# Patient Record
Sex: Male | Born: 1990 | Race: Black or African American | Hispanic: No | Marital: Single | Smoking: Current every day smoker
Health system: Southern US, Community
[De-identification: ages and names within clinical notes are randomized; demographics above are authoritative.]

## PROBLEM LIST (undated history)

## (undated) DIAGNOSIS — B20 Human immunodeficiency virus [HIV] disease: Secondary | ICD-10-CM

## (undated) DIAGNOSIS — Z21 Asymptomatic human immunodeficiency virus [HIV] infection status: Secondary | ICD-10-CM

## (undated) DIAGNOSIS — A64 Unspecified sexually transmitted disease: Secondary | ICD-10-CM

---

## 2009-08-04 ENCOUNTER — Emergency Department (HOSPITAL_COMMUNITY): Admission: EM | Admit: 2009-08-04 | Discharge: 2009-08-04 | Payer: Self-pay | Admitting: Emergency Medicine

## 2010-10-23 ENCOUNTER — Emergency Department (HOSPITAL_BASED_OUTPATIENT_CLINIC_OR_DEPARTMENT_OTHER)
Admission: EM | Admit: 2010-10-23 | Discharge: 2010-10-23 | Disposition: A | Discharge status: Home / Self Care | Payer: Self-pay | Attending: Emergency Medicine | Admitting: Emergency Medicine

## 2010-10-23 DIAGNOSIS — F17200 Nicotine dependence, unspecified, uncomplicated: Secondary | ICD-10-CM | POA: Insufficient documentation

## 2010-10-23 DIAGNOSIS — J45909 Unspecified asthma, uncomplicated: Secondary | ICD-10-CM | POA: Insufficient documentation

## 2010-10-23 DIAGNOSIS — J029 Acute pharyngitis, unspecified: Secondary | ICD-10-CM | POA: Insufficient documentation

## 2010-11-11 ENCOUNTER — Emergency Department (HOSPITAL_BASED_OUTPATIENT_CLINIC_OR_DEPARTMENT_OTHER)
Admission: EM | Admit: 2010-11-11 | Discharge: 2010-11-11 | Disposition: A | Discharge status: Home / Self Care | Payer: Self-pay | Attending: Emergency Medicine | Admitting: Emergency Medicine

## 2010-11-11 DIAGNOSIS — F17200 Nicotine dependence, unspecified, uncomplicated: Secondary | ICD-10-CM | POA: Insufficient documentation

## 2010-11-11 DIAGNOSIS — J45909 Unspecified asthma, uncomplicated: Secondary | ICD-10-CM | POA: Insufficient documentation

## 2010-11-11 DIAGNOSIS — N342 Other urethritis: Secondary | ICD-10-CM | POA: Insufficient documentation

## 2010-11-11 DIAGNOSIS — R300 Dysuria: Secondary | ICD-10-CM | POA: Insufficient documentation

## 2010-11-11 LAB — URINALYSIS, ROUTINE W REFLEX MICROSCOPIC
Bilirubin Urine: NEGATIVE
Glucose, UA: NEGATIVE mg/dL
Hgb urine dipstick: NEGATIVE
Ketones, ur: NEGATIVE mg/dL
Protein, ur: NEGATIVE mg/dL
Urobilinogen, UA: 1 mg/dL (ref 0.0–1.0)

## 2010-11-11 LAB — URINE MICROSCOPIC-ADD ON

## 2010-11-13 LAB — URINE CULTURE: Colony Count: NO GROWTH

## 2010-11-13 LAB — GC/CHLAMYDIA PROBE AMP, GENITAL: Chlamydia, DNA Probe: POSITIVE — AB

## 2010-11-19 LAB — URINE CULTURE: Colony Count: 9000

## 2010-11-19 LAB — URINALYSIS, ROUTINE W REFLEX MICROSCOPIC
Bilirubin Urine: NEGATIVE
Glucose, UA: NEGATIVE mg/dL
Nitrite: NEGATIVE
Specific Gravity, Urine: 1.023 (ref 1.005–1.030)
pH: 8.5 — ABNORMAL HIGH (ref 5.0–8.0)

## 2010-11-19 LAB — GC/CHLAMYDIA PROBE AMP, GENITAL: Chlamydia, DNA Probe: NEGATIVE

## 2010-11-19 LAB — URINE MICROSCOPIC-ADD ON

## 2011-03-22 ENCOUNTER — Emergency Department (HOSPITAL_BASED_OUTPATIENT_CLINIC_OR_DEPARTMENT_OTHER)
Admission: EM | Admit: 2011-03-22 | Discharge: 2011-03-22 | Disposition: A | Discharge status: Home / Self Care | Payer: 59 | Attending: Emergency Medicine | Admitting: Emergency Medicine

## 2011-03-22 ENCOUNTER — Encounter: Payer: Self-pay | Admitting: *Deleted

## 2011-03-22 DIAGNOSIS — J45909 Unspecified asthma, uncomplicated: Secondary | ICD-10-CM | POA: Insufficient documentation

## 2011-03-22 DIAGNOSIS — IMO0002 Reserved for concepts with insufficient information to code with codable children: Secondary | ICD-10-CM | POA: Insufficient documentation

## 2011-03-22 DIAGNOSIS — L0390 Cellulitis, unspecified: Secondary | ICD-10-CM

## 2011-03-22 MED ORDER — LIDOCAINE HCL (PF) 1 % IJ SOLN
5.0000 mL | Freq: Once | INTRAMUSCULAR | Status: AC
  Administered 2011-03-22: 5 mL via INTRADERMAL

## 2011-03-22 NOTE — ED Notes (Signed)
Abscess to the back of his upper right arm.

## 2011-03-22 NOTE — ED Notes (Signed)
Simple I&D of RUE abscess performed by Dr Dierdre Highman. Dressing applied. Pt instructed on continued care and f/u orders.

## 2011-03-22 NOTE — ED Provider Notes (Signed)
History     CSN: [id number] Arrival date & time: 03/22/2011  9:16 PM  Chief Complaint  Patient presents with  . Abscess   Patient is a 20 y.o. male presenting with abscess. The history is provided by the patient.  Abscess  This is a new problem. The current episode started yesterday. The problem occurs continuously. The problem has been gradually worsening. The abscess is present on the right arm. The problem is moderate. The abscess is characterized by redness and painfulness. The patient was exposed to an insect bite/sting. The abscess first occurred at home. Pertinent negatives include no fever, no diarrhea and no vomiting. His past medical history does not include atopy in family. There were no sick contacts. He has received no recent medical care.   Saw a spider on his arm and believes it may have bit him. Area became red and swollen and did drain some yesterday when squeezed and today is worse Past Medical History  Diagnosis Date  . Asthma     History reviewed. No pertinent past surgical history.  No family history on file.  History  Substance Use Topics  . Smoking status: Current Some Day Smoker  . Smokeless tobacco: Not on file  . Alcohol Use: No      Review of Systems  Constitutional: Negative for fever and chills.  HENT: Negative for neck pain and neck stiffness.   Eyes: Negative for pain.  Respiratory: Negative for shortness of breath.   Cardiovascular: Negative for chest pain.  Gastrointestinal: Negative for vomiting, abdominal pain and diarrhea.  Genitourinary: Negative for dysuria.  Musculoskeletal: Negative for back pain.  Skin: Positive for wound. Negative for rash.  Neurological: Negative for headaches.  All other systems reviewed and are negative.    Physical Exam  BP 119/75  Pulse 63  Temp(Src) 98.7 F (37.1 C) (Oral)  Resp 18  SpO2 99%  Physical Exam  Constitutional: He is oriented to person, place, and time. He appears well-developed and  well-nourished.  HENT:  Head: Normocephalic and atraumatic.  Eyes: Conjunctivae and EOM are normal. Pupils are equal, round, and reactive to light.  Neck: Trachea normal. Neck supple. No thyromegaly present.  Cardiovascular: Normal rate, regular rhythm, S1 normal, S2 normal and normal pulses.     No systolic murmur is present   No diastolic murmur is present  Pulses:      Radial pulses are 2+ on the right side, and 2+ on the left side.  Pulmonary/Chest: Effort normal and breath sounds normal. He has no rhonchi.  Abdominal: Soft. Normal appearance and bowel sounds are normal. There is no tenderness. There is no CVA tenderness and negative Murphy's sign.  Musculoskeletal:       RUE: area of fluctuance, tenderness, pointing and erythema to bicep area, no streaking, distal N/V intact, single lesion.   Neurological: He is alert and oriented to person, place, and time. He has normal strength. No cranial nerve deficit or sensory deficit. GCS eye subscore is 4. GCS verbal subscore is 5. GCS motor subscore is 6.  Skin: Skin is warm and dry. No rash noted. He is not diaphoretic.  Psychiatric: His speech is normal.       Cooperative and appropriate    ED Course  INCISION AND DRAINAGE Date/Time: 03/22/2011 10:40 PM Performed by: Sunnie Nielsen Authorized by: Sunnie Nielsen Consent: Verbal consent obtained. Risks and benefits: risks, benefits and alternatives were discussed Consent given by: patient Patient understanding: patient states understanding of the procedure being  performed Patient identity confirmed: verbally with patient Time out: Immediately prior to procedure a "time out" was called to verify the correct patient, procedure, equipment, support staff and site/side marked as required. Type: abscess Body area: upper extremity Location details: right arm Anesthesia: local infiltration Local anesthetic: lidocaine 1% without epinephrine Anesthetic total: 2 ml Risk factor: underlying major  vessel Scalpel size: 11 Needle gauge: 22 Incision type: elliptical Complexity: complex Drainage: purulent Drainage amount: moderate Wound treatment: wound left open Packing material: Vaseline gauze Patient tolerance: Patient tolerated the procedure well with no immediate complications.    MDM R arm abscess treated I/D. No indication for ABx.       Sunnie Nielsen, MD 03/22/11 2242

## 2011-03-22 NOTE — ED Notes (Signed)
Pt states was bitten by a "baby thin brown spider" last night, and the area is now red/swollen/tender to touch. Pt states he tried to "pop the white head" last night, but that the appearance has progressively worsened.

## 2011-07-01 ENCOUNTER — Emergency Department (HOSPITAL_BASED_OUTPATIENT_CLINIC_OR_DEPARTMENT_OTHER)
Admission: EM | Admit: 2011-07-01 | Discharge: 2011-07-01 | Disposition: A | Discharge status: Home / Self Care | Payer: 59 | Attending: Emergency Medicine | Admitting: Emergency Medicine

## 2011-07-01 ENCOUNTER — Encounter (HOSPITAL_BASED_OUTPATIENT_CLINIC_OR_DEPARTMENT_OTHER): Payer: Self-pay | Admitting: *Deleted

## 2011-07-01 DIAGNOSIS — N342 Other urethritis: Secondary | ICD-10-CM

## 2011-07-01 DIAGNOSIS — R109 Unspecified abdominal pain: Secondary | ICD-10-CM | POA: Insufficient documentation

## 2011-07-01 DIAGNOSIS — F17200 Nicotine dependence, unspecified, uncomplicated: Secondary | ICD-10-CM | POA: Insufficient documentation

## 2011-07-01 MED ORDER — CEFTRIAXONE SODIUM 250 MG IJ SOLR
250.0000 mg | Freq: Once | INTRAMUSCULAR | Status: AC
  Administered 2011-07-01: 250 mg via INTRAMUSCULAR
  Filled 2011-07-01: qty 250

## 2011-07-01 MED ORDER — DOXYCYCLINE HYCLATE 100 MG PO CAPS: 100.0000 mg | ORAL_CAPSULE | Freq: Two times a day (BID) | ORAL | Status: AC

## 2011-07-01 MED ORDER — DOXYCYCLINE HYCLATE 100 MG PO CAPS: 100.0000 mg | ORAL_CAPSULE | Freq: Two times a day (BID) | ORAL | Status: DC

## 2011-07-01 MED ORDER — AZITHROMYCIN 250 MG PO TABS
1000.0000 mg | ORAL_TABLET | Freq: Once | ORAL | Status: AC
  Administered 2011-07-01: 1000 mg via ORAL
  Filled 2011-07-01: qty 4

## 2011-07-01 NOTE — ED Provider Notes (Signed)
History  Scribed for Gavin Pound. Remington Highbaugh, MD, the patient was seen in room MH10. This chart was scribed by Hillery Hunter.   CSN: [id number] Arrival date & time: 07/01/2011  4:30 PM   First MD Initiated Contact with Patient 07/01/11 1649      Chief Complaint  Patient presents with  . SEXUALLY TRANSMITTED DISEASE    Pt. reports burning and discharge  . Abdominal Pain    no vomiting or diarrhea    The history is provided by the patient.   William Sellers is a 20 y.o. male who presents to the Emergency Department complaining of burning dysuria since 20:00 last night (21 hours ago) with some yellow penile discharge. He reports some additional lower abdominal discomfort but denies fevers, genital lesions, and any other associated symptoms.  He reports that he had unprotected sex two weeks ago and is concerned that he might have contracted gonorrhea which he had similar symptoms two years ago with confirmed diagnosis. He describes the dysuria as burning that starts at the very beginning of urination and then after finishing urinating. He states he quit smoking cigarettes and does not take any current medications.     Past Medical History  Diagnosis Date  . Asthma     History reviewed. No pertinent past surgical history.  History reviewed. No pertinent family history.  History  Substance Use Topics  . Smoking status: Current Some Day Smoker  . Smokeless tobacco: Not on file  . Alcohol Use: No      Review of Systems  Constitutional: Negative for fever.  Gastrointestinal: Positive for abdominal pain (lower abdomen).  Genitourinary: Positive for dysuria and discharge (yellow). Negative for genital sores.  Skin: Negative for rash.    Allergies  Review of patient's allergies indicates no known allergies.  Home Medications   Current Outpatient Rx  Name Route Sig Dispense Refill  . BC HEADACHE POWDER PO Oral Take 1 packet by mouth once as needed. For pain     .  DOXYCYCLINE HYCLATE 100 MG PO CAPS Oral Take 1 capsule (100 mg total) by mouth 2 (two) times daily. 20 capsule 0  . IBUPROFEN 200 MG PO TABS Oral Take 200 mg by mouth once as needed. For pain     . FISH OIL CONCENTRATE PO Oral Take 1 capsule by mouth daily.        BP 120/63  Pulse 75  Temp(Src) 98 F (36.7 C) (Oral)  Resp 18  Ht 6\' 1"  (1.854 m)  Wt 142 lb (64.411 kg)  BMI 18.73 kg/m2  SpO2 99%  Physical Exam  Nursing note and vitals reviewed. Constitutional: He is oriented to person, place, and time. He appears well-developed and well-nourished. No distress.  HENT:  Head: Normocephalic and atraumatic.  Eyes: Conjunctivae are normal. Right eye exhibits no discharge. Left eye exhibits no discharge.  Neck: Neck supple.  Cardiovascular: Normal rate and regular rhythm.   Pulmonary/Chest: Effort normal. No respiratory distress.  Genitourinary:       Yellow discharge from urethral meatus, no genital lesions  Neurological: He is alert and oriented to person, place, and time.  Skin: Skin is warm and dry.  Psychiatric: He has a normal mood and affect. His behavior is normal.    ED Course  Procedures    Labs Reviewed  GC/CHLAMYDIA PROBE AMP, GENITAL     OTHER DATA REVIEWED: Nursing notes, vital signs reviewed.    DIAGNOSTIC STUDIES: Oxygen Saturation is 99% on room air, normal by my  interpretation.     ED COURSE / COORDINATION OF CARE: 17:00. Ordered: GC/chlamydia probe amp, genital, cefTRIAXone (ROCEPHIN) injection 250 mg ; azithromycin (ZITHROMAX) tablet 1,000 mg, anticipate discharge.    MDM  Exam is consistent with STD.  Culture probe sent.  Safe sex practices encouraged.  abd is soft, no loss of apettite.  No testicular or scrotal involvement.  IM ceftriax and zithromax and prescription for doxyc given   1. Urethritis       I personally performed the services described in this documentation, which was scribed in my presence. The recorded information has been  reviewed and considered. Virgene Tirone Y.    Gavin Pound. Oletta Lamas, MD 07/01/11 1610

## 2011-07-01 NOTE — ED Notes (Signed)
Pt. Reports STD approx. 2 yrs ago and he is now having discharge and burning with urination.  Pt. Reports low abd. pain

## 2011-07-02 LAB — GC/CHLAMYDIA PROBE AMP, GENITAL
Chlamydia, DNA Probe: NEGATIVE
GC Probe Amp, Genital: POSITIVE — AB

## 2011-07-03 NOTE — ED Notes (Addendum)
+   gonorrhea Patient treated with Rocephin and Zithromax.[facility] letter faxed.Call and notify patient. Patient informed of positive results after id'd x 2 and informed of need to notify partner to be treated.

## 2012-08-28 ENCOUNTER — Emergency Department (HOSPITAL_BASED_OUTPATIENT_CLINIC_OR_DEPARTMENT_OTHER)
Admission: EM | Admit: 2012-08-28 | Discharge: 2012-08-28 | Disposition: A | Discharge status: Home / Self Care | Payer: 59 | Attending: Emergency Medicine | Admitting: Emergency Medicine

## 2012-08-28 ENCOUNTER — Encounter (HOSPITAL_BASED_OUTPATIENT_CLINIC_OR_DEPARTMENT_OTHER): Payer: Self-pay | Admitting: Emergency Medicine

## 2012-08-28 DIAGNOSIS — R109 Unspecified abdominal pain: Secondary | ICD-10-CM | POA: Insufficient documentation

## 2012-08-28 DIAGNOSIS — N453 Epididymo-orchitis: Secondary | ICD-10-CM | POA: Insufficient documentation

## 2012-08-28 DIAGNOSIS — N451 Epididymitis: Secondary | ICD-10-CM

## 2012-08-28 DIAGNOSIS — Z79899 Other long term (current) drug therapy: Secondary | ICD-10-CM | POA: Insufficient documentation

## 2012-08-28 DIAGNOSIS — J45909 Unspecified asthma, uncomplicated: Secondary | ICD-10-CM | POA: Insufficient documentation

## 2012-08-28 DIAGNOSIS — Z87891 Personal history of nicotine dependence: Secondary | ICD-10-CM | POA: Insufficient documentation

## 2012-08-28 MED ORDER — OXYCODONE-ACETAMINOPHEN 5-325 MG PO TABS: 2.0000 | ORAL_TABLET | ORAL | Status: DC | PRN

## 2012-08-28 MED ORDER — AZITHROMYCIN 1 G PO PACK
1.0000 g | PACK | Freq: Once | ORAL | Status: AC
  Administered 2012-08-28: 1 g via ORAL
  Filled 2012-08-28: qty 1

## 2012-08-28 MED ORDER — OXYCODONE-ACETAMINOPHEN 5-325 MG PO TABS
2.0000 | ORAL_TABLET | Freq: Once | ORAL | Status: AC
  Administered 2012-08-28: 2 via ORAL
  Filled 2012-08-28 (×2): qty 2

## 2012-08-28 NOTE — ED Provider Notes (Signed)
History     CSN: [id number]  Arrival date & time 08/28/12  2029   First MD Initiated Contact with Patient 08/28/12 2038      Chief Complaint  Patient presents with  . Testicle Pain  . Abdominal Pain     HPI Pt c/o generalized lower abd pain and groin area w/ swollen Rt testicle x 2 days. Pt denies N/V/D, fever, or dysuria.  Onset of pain gradual.  Pain originates in testicle and radiates to the lower abdomen.  No history of trauma.  Past Medical History  Diagnosis Date  . Asthma     History reviewed. No pertinent past surgical history.  No family history on file.  History  Substance Use Topics  . Smoking status: Former Games developer  . Smokeless tobacco: Not on file  . Alcohol Use: Yes     Comment: ocassionally      Review of Systems All other systems reviewed and are negative Allergies  Review of patient's allergies indicates no known allergies.  Home Medications   Current Outpatient Rx  Name  Route  Sig  Dispense  Refill  . BC HEADACHE POWDER PO   Oral   Take 1 packet by mouth once as needed. For pain          . IBUPROFEN 200 MG PO TABS   Oral   Take 200 mg by mouth once as needed. For pain          . FISH OIL CONCENTRATE PO   Oral   Take 1 capsule by mouth daily.           . OXYCODONE-ACETAMINOPHEN 5-325 MG PO TABS   Oral   Take 2 tablets by mouth every 4 (four) hours as needed for pain.   6 tablet   0     BP 115/66  Pulse 85  Temp 99.4 F (37.4 C) (Oral)  Resp 20  Ht 6\' 1"  (1.854 m)  Wt 147 lb (66.679 kg)  BMI 19.39 kg/m2  SpO2 98%  Physical Exam  Nursing note and vitals reviewed. Constitutional: He is oriented to person, place, and time. He appears well-developed and well-nourished. No distress.  HENT:  Head: Normocephalic and atraumatic.  Eyes: Pupils are equal, round, and reactive to light.  Neck: Normal range of motion.  Cardiovascular: Normal rate and intact distal pulses.   Pulmonary/Chest: No respiratory distress.    Abdominal: Normal appearance. He exhibits no distension. Hernia confirmed negative in the right inguinal area.  Genitourinary: Cremasteric reflex is present. Right testis shows tenderness (Right epididymis). No discharge found.  Musculoskeletal: Normal range of motion.  Neurological: He is alert and oriented to person, place, and time. No cranial nerve deficit.  Skin: Skin is warm and dry. No rash noted.  Psychiatric: He has a normal mood and affect. His behavior is normal.    ED Course  Procedures (including critical care time)  Medications  azithromycin (ZITHROMAX) powder 1 g (not administered)  oxyCODONE-acetaminophen (PERCOCET/ROXICET) 5-325 MG per tablet 2 tablet (not administered)  oxyCODONE-acetaminophen (PERCOCET/ROXICET) 5-325 MG per tablet (not administered)    Labs Reviewed - No data to display No results found.   1. Epididymitis       MDM          Nelia Shi, MD 08/28/12 [phone]

## 2012-08-28 NOTE — ED Notes (Addendum)
Pt c/o generalized lower abd pain and groin area w/ swollen Rt testicle x 2 days. Pt denies N/V/D, fever, or dysuria. Pt denies penile d/c.

## 2012-10-30 ENCOUNTER — Telehealth (HOSPITAL_COMMUNITY): Payer: Self-pay | Admitting: *Deleted

## 2012-10-30 ENCOUNTER — Emergency Department (HOSPITAL_BASED_OUTPATIENT_CLINIC_OR_DEPARTMENT_OTHER)
Admission: EM | Admit: 2012-10-30 | Discharge: 2012-10-30 | Disposition: A | Discharge status: Home / Self Care | Payer: 59 | Attending: Emergency Medicine | Admitting: Emergency Medicine

## 2012-10-30 ENCOUNTER — Encounter (HOSPITAL_BASED_OUTPATIENT_CLINIC_OR_DEPARTMENT_OTHER): Payer: Self-pay

## 2012-10-30 DIAGNOSIS — R197 Diarrhea, unspecified: Secondary | ICD-10-CM | POA: Insufficient documentation

## 2012-10-30 DIAGNOSIS — R350 Frequency of micturition: Secondary | ICD-10-CM | POA: Insufficient documentation

## 2012-10-30 DIAGNOSIS — A64 Unspecified sexually transmitted disease: Secondary | ICD-10-CM | POA: Insufficient documentation

## 2012-10-30 DIAGNOSIS — R109 Unspecified abdominal pain: Secondary | ICD-10-CM | POA: Insufficient documentation

## 2012-10-30 DIAGNOSIS — R300 Dysuria: Secondary | ICD-10-CM | POA: Insufficient documentation

## 2012-10-30 DIAGNOSIS — J45909 Unspecified asthma, uncomplicated: Secondary | ICD-10-CM | POA: Insufficient documentation

## 2012-10-30 DIAGNOSIS — Z87891 Personal history of nicotine dependence: Secondary | ICD-10-CM | POA: Insufficient documentation

## 2012-10-30 LAB — URINALYSIS, ROUTINE W REFLEX MICROSCOPIC
Protein, ur: NEGATIVE mg/dL
Urobilinogen, UA: 0.2 mg/dL (ref 0.0–1.0)

## 2012-10-30 LAB — CBC WITH DIFFERENTIAL/PLATELET
Band Neutrophils: 1 % (ref 0–10)
Basophils Relative: 0 % (ref 0–1)
HCT: 46.5 % (ref 39.0–52.0)
Hemoglobin: 16.6 g/dL (ref 13.0–17.0)
Lymphs Abs: 1 10*3/uL (ref 0.7–4.0)
MCH: 29.7 pg (ref 26.0–34.0)
MCHC: 35.7 g/dL (ref 30.0–36.0)
MCV: 83.3 fL (ref 78.0–100.0)
Neutro Abs: 4.2 10*3/uL (ref 1.7–7.7)

## 2012-10-30 LAB — BASIC METABOLIC PANEL
CO2: 27 mEq/L (ref 19–32)
Calcium: 9.9 mg/dL (ref 8.4–10.5)
Sodium: 139 mEq/L (ref 135–145)

## 2012-10-30 LAB — HIV ANTIBODY (ROUTINE TESTING W REFLEX): HIV: NONREACTIVE

## 2012-10-30 LAB — URINE MICROSCOPIC-ADD ON

## 2012-10-30 LAB — RPR TITER: RPR Titer: 1:8 {titer} — AB

## 2012-10-30 MED ORDER — PENICILLIN G BENZATHINE 1200000 UNIT/2ML IM SUSP
2.4000 10*6.[IU] | Freq: Once | INTRAMUSCULAR | Status: AC
  Administered 2012-10-30: 2.4 10*6.[IU] via INTRAMUSCULAR
  Filled 2012-10-30 (×2): qty 2

## 2012-10-30 NOTE — ED Provider Notes (Signed)
History     CSN: [id number]  Arrival date & time 10/30/12  1153   First MD Initiated Contact with Patient 10/30/12 1212      Chief Complaint  Patient presents with  . Abdominal Pain  . Diarrhea  . Urinary Frequency    (Consider location/radiation/quality/duration/timing/severity/associated sxs/prior treatment) HPI Comments: Patient is a 22 y/o M with PMHx of asthma, c/o abdominal pain, diarrhea, and dysuria x 1 day. Patient reported that abdominal pain started at approximately 7:00pm yesterday (10/29/2012), reported discomfort to be mainly in the central abdominal region, described as a gaseous and bloated feeling, rated pain 7/10 with no radiation, discomfort improved with burping. Patient reported 4 episodes of diarrhea, last BM was 9:00am today (10/30/2012), patient described as loose stool, reporting no mucus or blood. Patient reported dysuria beginning this morning (10/30/2012), reported that he noticed a "cut" on his penis this morning, pain occur when urine hits the "cut." Patient has not been using any medication. Denied nausea, fever, vomiting, chest pain, SOB, difficulty breathing, HA, dizziness, sore throat, changes to appetite, nausea.  Patient reported being sexually active with both males and females. Patient reported using protection. Reported last event of sexual activity to be approximately one week ago, patient reported using protection at this time. Patient denied discharge, denied ever having penile sores/lesions.  Patient reported mass to lower left inguinal region, stated that he just noticed mass this morning (10/30/2012). Stated that patient was moving furniture around Monday (10/26/2012). Denied pain, inflammation, swelling.     The history is provided by the patient.    Past Medical History  Diagnosis Date  . Asthma     History reviewed. No pertinent past surgical history.  No family history on file.  History  Substance Use Topics  . Smoking status: Former  Games developer  . Smokeless tobacco: Not on file  . Alcohol Use: Yes     Comment: ocassionally      Review of Systems  Constitutional: Negative for appetite change.  HENT: Negative for congestion and neck pain.   Eyes: Negative for pain.  Gastrointestinal: Positive for abdominal pain and diarrhea.       Abdominal mass to lower left inguinal region   Genitourinary: Negative for discharge.       Penile lesion   Musculoskeletal: Negative for back pain and arthralgias.  Skin: Negative for rash.  Neurological: Negative for dizziness, light-headedness and headaches.    Allergies  Review of patient's allergies indicates no known allergies.  Home Medications  No current outpatient prescriptions on file.  BP 119/72  Pulse 79  Temp(Src) 98.2 F (36.8 C) (Oral)  Resp 18  Ht 6\' 1"  (1.854 m)  Wt 147 lb (66.679 kg)  BMI 19.4 kg/m2  SpO2 100%  Physical Exam  Nursing note and vitals reviewed. Constitutional: He appears well-developed and well-nourished.  HENT:  Right Ear: External ear normal.  Left Ear: External ear normal.  Mouth/Throat: Oropharynx is clear and moist. No oropharyngeal exudate.  Eyes: Conjunctivae and EOM are normal. Pupils are equal, round, and reactive to light. Right eye exhibits no discharge. Left eye exhibits no discharge.  Neck: Normal range of motion. Neck supple.  Negative lymphadenopathy  Cardiovascular: Normal rate, regular rhythm, normal heart sounds and intact distal pulses.   No murmur heard. Pulmonary/Chest: Effort normal and breath sounds normal. He has no wheezes. He has no rales.  Abdominal: Soft. Bowel sounds are normal. He exhibits no distension. There is no tenderness. There is no rebound.  Abdomen  non-distended Bowel sounds normoactive Soft, non-tender upon light and deep palpation   Positive left inguinal lymphadenpathy - soft, mobile, non-tender to palpation   Genitourinary:  1.5-43mm pink papule located on tip of penis - possible chancre No  discharge   Lymphadenopathy:    He has no cervical adenopathy.  Skin: Skin is warm and dry. No rash noted. He is not diaphoretic.  No rash to palms of hands and soles of feet  Psychiatric: He has a normal mood and affect. His behavior is normal.    ED Course  Procedures (including critical care time)  Labs Reviewed  URINALYSIS, ROUTINE W REFLEX MICROSCOPIC - Abnormal; Notable for the following:    Leukocytes, UA TRACE (*)    All other components within normal limits  CBC WITH DIFFERENTIAL - Abnormal; Notable for the following:    Monocytes Relative 14 (*)    All other components within normal limits  GC/CHLAMYDIA PROBE AMP  URINE MICROSCOPIC-ADD ON  BASIC METABOLIC PANEL  RPR  HIV ANTIBODY (ROUTINE TESTING)   Blood pressure 119/72, pulse 79, temperature 98.2 F (36.8 C), temperature source Oral, resp. rate 18, height 6\' 1"  (1.854 m), weight 147 lb (66.679 kg), SpO2 100.00%.  Results for orders placed during the hospital encounter of 10/30/12  URINALYSIS, ROUTINE W REFLEX MICROSCOPIC      Result Value Range   Color, Urine YELLOW  YELLOW   APPearance CLEAR  CLEAR   Specific Gravity, Urine 1.025  1.005 - 1.030   pH 7.0  5.0 - 8.0   Glucose, UA NEGATIVE  NEGATIVE mg/dL   Hgb urine dipstick NEGATIVE  NEGATIVE   Bilirubin Urine NEGATIVE  NEGATIVE   Ketones, ur NEGATIVE  NEGATIVE mg/dL   Protein, ur NEGATIVE  NEGATIVE mg/dL   Urobilinogen, UA 0.2  0.0 - 1.0 mg/dL   Nitrite NEGATIVE  NEGATIVE   Leukocytes, UA TRACE (*) NEGATIVE  URINE MICROSCOPIC-ADD ON      Result Value Range   Squamous Epithelial / LPF RARE  RARE   WBC, UA 3-6  <3 WBC/hpf   RBC / HPF 0-2  <3 RBC/hpf   Bacteria, UA RARE  RARE   Urine-Other MUCOUS PRESENT    CBC WITH DIFFERENTIAL      Result Value Range   WBC 6.2  4.0 - 10.5 K/uL   RBC 5.58  4.22 - 5.81 MIL/uL   Hemoglobin 16.6  13.0 - 17.0 g/dL   HCT 40.9  81.1 - 91.4 %   MCV 83.3  78.0 - 100.0 fL   MCH 29.7  26.0 - 34.0 pg   MCHC 35.7  30.0 - 36.0  g/dL   RDW 78.2  95.6 - 21.3 %   Platelets 254  150 - 400 K/uL   Neutrophils Relative 66  43 - 77 %   Lymphocytes Relative 16  12 - 46 %   Monocytes Relative 14 (*) 3 - 12 %   Eosinophils Relative 2  0 - 5 %   Basophils Relative 0  0 - 1 %   Band Neutrophils 1  0 - 10 %   Myelocytes 1     Neutro Abs 4.2  1.7 - 7.7 K/uL   Lymphs Abs 1.0  0.7 - 4.0 K/uL   Monocytes Absolute 0.9  0.1 - 1.0 K/uL   Eosinophils Absolute 0.1  0.0 - 0.7 K/uL   Basophils Absolute 0.0  0.0 - 0.1 K/uL   WBC Morphology ATYPICAL LYMPHOCYTES     Smear Review LARGE  PLATELETS PRESENT    BASIC METABOLIC PANEL      Result Value Range   Sodium 139  135 - 145 mEq/L   Potassium 3.9  3.5 - 5.1 mEq/L   Chloride 101  96 - 112 mEq/L   CO2 27  19 - 32 mEq/L   Glucose, Bld 90  70 - 99 mg/dL   BUN 10  6 - 23 mg/dL   Creatinine, Ser 6.04  0.50 - 1.35 mg/dL   Calcium 9.9  8.4 - 54.0 mg/dL   GFR calc non Af Amer >90  >90 mL/min   GFR calc Af Amer >90  >90 mL/min   No results found.     MDM  Patient is a 22 y/o M with PMHx of asthma, c/o abdominal pain, diarrhea, dysuria x 1 day.  1.) Abdominal pain with diarrhea Denied vomiting, nausea, fever, chest pain, SOB PE: Abdomen - non-distended, BS normoactive, soft non-tender to palpation. CBC and CMP order to r/o possible viral gastroenteritis  CBC - negative findings, within normal limits BMP- negative findings  2.) Dysuria Patient reported "cut" on tip of penis, pain when urine contacts "cut." Patient sexually active with both males and females, reported last sexual encounter was 1 week ago, reported using protection. PE: 1.5-31mm pink papule, suspected chancre, on tip of penis. No discharge, swelling, inflammation to penis or groin region. Left inguinal lymphadenopathy - mobile, soft, non-tender. Reviewed chart, patient has had history of STDs - diagnosed with GC/chlamydia on 11/11/2010 and 07/01/2011.  Ordered STD Panel (GC/Chlamydia, Syphilis, HIV), UA to r/o STD  infection  Obtained verbal consent from patient for HIV testing. UA- trace of leukocytes; negative leukocytes, protein, bacteria, glucose Administered PCN G Benzathine IM 2.4 Million Units Discussed with patient that he will receive a phone call regarding results for HIV, GC/Chlamydia, and Syphilis if positive, if negative no call.  Discussed with the patient STDs, forms of protective sex, and importance of following up with [facility].  Referred/recommended to [facility]  Patient afebrile, normotensive, non-tachycardic, with happy affect. Discharged patient with information to follow-up with [facility]D Clinic - recommended. Spoke with patient regarding the need to practice safe sex. Discussed with patient the B.R.A.T diet to help with diarrhea and to stay hydrated. Discussed with patient that if symptoms were to worsen to return to the ED.  Patient gave numbers and would prefer to be reached using these numbers: [phone]   Patient agreed to plan, understood, all questions answered.               Raymon Mutton, PA-C 10/30/12 2122

## 2012-10-30 NOTE — ED Notes (Signed)
Pt refused IV  

## 2012-10-30 NOTE — ED Notes (Signed)
Pt reports abdominal pain, diarrhea and urinary burning that started yesterday.

## 2012-10-31 ENCOUNTER — Telehealth (HOSPITAL_COMMUNITY): Payer: Self-pay | Admitting: Emergency Medicine

## 2012-10-31 NOTE — ED Provider Notes (Signed)
Medical screening examination/treatment/procedure(s) were performed by non-physician practitioner and as supervising physician I was immediately available for consultation/collaboration.  Geoffery Lyons, MD 10/31/12 [phone]

## 2012-10-31 NOTE — ED Notes (Signed)
Patient has +RPR culture. Checking to see if appropriately treated.

## 2012-10-31 NOTE — ED Notes (Signed)
+   RPR- Chart sent to EDP office for review. 

## 2012-11-01 ENCOUNTER — Telehealth (HOSPITAL_COMMUNITY): Payer: Self-pay | Admitting: Emergency Medicine

## 2012-11-01 NOTE — ED Notes (Signed)
Chart returned from EDP office. Per Marlon Pel PA-C, patient has already been treated with injection of 2.4 million Bicillin LA. Needs to be made aware and follow-up with STD clinic to confirm resolution of syphilis. Very important he follows-up.

## 2012-11-04 LAB — GC/CHLAMYDIA PROBE AMP
CT Probe RNA: POSITIVE — AB
GC Probe RNA: NEGATIVE

## 2012-11-04 NOTE — ED Provider Notes (Signed)
Pt seen by me,   Pt's labs returned RPR is positive,  Hiv is negative.   Flow manager reports pt will be notified,   Health department notified  Elson Areas, [place] 11/04/12 2304

## 2012-11-06 NOTE — ED Provider Notes (Signed)
Medical screening examination/treatment/procedure(s) were performed by non-physician practitioner and as supervising physician I was immediately available for consultation/collaboration.  Geoffery Lyons, MD 11/06/12 [phone]

## 2013-03-01 ENCOUNTER — Encounter (HOSPITAL_BASED_OUTPATIENT_CLINIC_OR_DEPARTMENT_OTHER): Payer: Self-pay | Admitting: Emergency Medicine

## 2013-03-01 ENCOUNTER — Emergency Department (HOSPITAL_BASED_OUTPATIENT_CLINIC_OR_DEPARTMENT_OTHER)
Admission: EM | Admit: 2013-03-01 | Discharge: 2013-03-01 | Disposition: A | Discharge status: Home / Self Care | Payer: Self-pay | Attending: Emergency Medicine | Admitting: Emergency Medicine

## 2013-03-01 DIAGNOSIS — Z87891 Personal history of nicotine dependence: Secondary | ICD-10-CM | POA: Insufficient documentation

## 2013-03-01 DIAGNOSIS — J45909 Unspecified asthma, uncomplicated: Secondary | ICD-10-CM | POA: Insufficient documentation

## 2013-03-01 DIAGNOSIS — R300 Dysuria: Secondary | ICD-10-CM | POA: Insufficient documentation

## 2013-03-01 DIAGNOSIS — Z8619 Personal history of other infectious and parasitic diseases: Secondary | ICD-10-CM | POA: Insufficient documentation

## 2013-03-01 DIAGNOSIS — R369 Urethral discharge, unspecified: Secondary | ICD-10-CM | POA: Insufficient documentation

## 2013-03-01 DIAGNOSIS — N342 Other urethritis: Secondary | ICD-10-CM | POA: Insufficient documentation

## 2013-03-01 HISTORY — DX: Unspecified sexually transmitted disease: A64

## 2013-03-01 LAB — URINALYSIS, ROUTINE W REFLEX MICROSCOPIC
Bilirubin Urine: NEGATIVE
Glucose, UA: NEGATIVE mg/dL
Hgb urine dipstick: NEGATIVE
Ketones, ur: NEGATIVE mg/dL
Nitrite: NEGATIVE
Protein, ur: NEGATIVE mg/dL
Specific Gravity, Urine: 1.012 (ref 1.005–1.030)
Urobilinogen, UA: 1 mg/dL (ref 0.0–1.0)
pH: 6.5 (ref 5.0–8.0)

## 2013-03-01 LAB — URINE MICROSCOPIC-ADD ON

## 2013-03-01 MED ORDER — CEFTRIAXONE SODIUM 250 MG IJ SOLR
250.0000 mg | Freq: Once | INTRAMUSCULAR | Status: AC
  Administered 2013-03-01: 250 mg via INTRAMUSCULAR
  Filled 2013-03-01: qty 250

## 2013-03-01 MED ORDER — LIDOCAINE HCL (PF) 1 % IJ SOLN
INTRAMUSCULAR | Status: AC
  Administered 2013-03-01: 1.2 mL
  Filled 2013-03-01: qty 5

## 2013-03-01 MED ORDER — AZITHROMYCIN 250 MG PO TABS
1000.0000 mg | ORAL_TABLET | Freq: Once | ORAL | Status: AC
  Administered 2013-03-01: 1000 mg via ORAL
  Filled 2013-03-01: qty 4

## 2013-03-01 NOTE — ED Notes (Signed)
Pt c/o pain with urination and abd pain onset tonight

## 2013-03-01 NOTE — ED Provider Notes (Addendum)
History     This chart was scribed for Gwyneth Sprout, MD by Jiles Prows, ED Scribe. The patient was seen in room MH04/MH04 and the patient's care was started at 10:23 PM.  CSN: [id number] Arrival date & time 03/01/13  2214   Chief Complaint  Patient presents with  . Abdominal Pain  . Dysuria   Patient is a 22 y.o. male presenting with dysuria. The history is provided by the patient and medical records. No language interpreter was used.  Dysuria This is a new problem. The current episode started yesterday. The problem occurs constantly. The problem has not changed since onset.Associated symptoms include abdominal pain. Nothing aggravates the symptoms. Nothing relieves the symptoms. He has tried nothing for the symptoms. The treatment provided no relief.   HPI Comments: William Sellers is a 22 y.o. male with a h/o asthma who presents to the Emergency Department complaining of dysuria and white discharge from penis onset last night.  Pt reports that it hurts when he urinates.  He claims he is single now, but had a sexual partner for 3 months.  He reports intermittently using protection with intercourse during this time.  Pt claims abdominal pain after eating this evening.  Pt denies headache, diaphoresis, fever, chills, nausea, vomiting, diarrhea, weakness, cough, SOB and any other pain.   Past Medical History  Diagnosis Date  . Asthma   . STD (male)    History reviewed. No pertinent past surgical history. No family history on file. History  Substance Use Topics  . Smoking status: Former Games developer  . Smokeless tobacco: Not on file  . Alcohol Use: Yes     Comment: ocassionally    Review of Systems  Gastrointestinal: Positive for abdominal pain.  Genitourinary: Positive for dysuria.   A complete 10 system review of systems was obtained and all systems are negative except as noted in the HPI and PMH.   Allergies  Review of patient's allergies indicates no known allergies.  Home  Medications  No current outpatient prescriptions on file.  BP 124/72  Pulse 62  Temp(Src) 98.4 F (36.9 C) (Oral)  Resp 18  Ht 6\' 1"  (1.854 m)  Wt 147 lb (66.679 kg)  BMI 19.4 kg/m2  SpO2 97%  Physical Exam  Nursing note and vitals reviewed. Constitutional: He is oriented to person, place, and time. He appears well-developed and well-nourished. No distress.  HENT:  Head: Normocephalic and atraumatic.  Eyes: EOM are normal.  Neck: Neck supple. No tracheal deviation present.  Cardiovascular: Normal rate, regular rhythm, normal heart sounds and intact distal pulses.  Exam reveals no gallop and no friction rub.   No murmur heard. Pulmonary/Chest: Effort normal and breath sounds normal. No respiratory distress. He has no wheezes. He has no rales. He exhibits no tenderness.  Abdominal: Soft. He exhibits no distension. There is no tenderness. There is no rebound and no guarding.  Genitourinary:     Musculoskeletal: Normal range of motion.  Neurological: He is alert and oriented to person, place, and time.  Skin: Skin is warm and dry.  Psychiatric: He has a normal mood and affect. His behavior is normal.   ED Course  Procedures (including critical care time) DIAGNOSTIC STUDIES: Filed Vitals:   03/01/13 2219  BP: 124/72  Pulse: 62  Temp: 98.4 F (36.9 C)  TempSrc: Oral  Resp: 18  Height: 6\' 1"  (1.854 m)  Weight: 147 lb (66.679 kg)  SpO2: 97%   COORDINATION OF CARE:   Labs Reviewed  URINALYSIS, ROUTINE W REFLEX MICROSCOPIC - Abnormal; Notable for the following:    Leukocytes, UA LARGE (*)    All other components within normal limits  URINE MICROSCOPIC-ADD ON - Abnormal; Notable for the following:    Bacteria, UA FEW (*)    All other components within normal limits  URINE CULTURE  GC/CHLAMYDIA PROBE AMP   No results found. 1. Urethritis     MDM   Patient with symptoms classic for urethritis. He is sexually active and unprotected. He noticed penile discharge and  lower abdominal pain with dysuria starting yesterday. His symptoms concerning for pyelonephritis and otherwise patient is well-appearing. He is a with large leukocytes and 11-21 white blood cells. Patient's father GC and Chlamydia and treated with Rocephin and azithromycin  I personally performed the services described in this documentation, which was scribed in my presence.  The recorded information has been reviewed and considered.   Gwyneth Sprout, MD 03/01/13 2240  Gwyneth Sprout, MD 03/01/13 2242

## 2013-03-03 LAB — URINE CULTURE
Colony Count: NO GROWTH
Culture: NO GROWTH

## 2013-03-03 LAB — GC/CHLAMYDIA PROBE AMP: GC Probe RNA: POSITIVE — AB

## 2013-03-05 NOTE — ED Notes (Signed)
+   Gonorrhea  Patient treated with Rocephin And Zithromax-[facility] faxed  

## 2013-09-02 ENCOUNTER — Encounter (HOSPITAL_BASED_OUTPATIENT_CLINIC_OR_DEPARTMENT_OTHER): Payer: Self-pay | Admitting: Emergency Medicine

## 2013-09-02 ENCOUNTER — Emergency Department (HOSPITAL_BASED_OUTPATIENT_CLINIC_OR_DEPARTMENT_OTHER)
Admission: EM | Admit: 2013-09-02 | Discharge: 2013-09-02 | Disposition: A | Discharge status: Home / Self Care | Payer: BC Managed Care – PPO | Attending: Emergency Medicine | Admitting: Emergency Medicine

## 2013-09-02 DIAGNOSIS — A549 Gonococcal infection, unspecified: Secondary | ICD-10-CM

## 2013-09-02 DIAGNOSIS — A5400 Gonococcal infection of lower genitourinary tract, unspecified: Secondary | ICD-10-CM | POA: Insufficient documentation

## 2013-09-02 DIAGNOSIS — J45909 Unspecified asthma, uncomplicated: Secondary | ICD-10-CM | POA: Insufficient documentation

## 2013-09-02 DIAGNOSIS — Z87891 Personal history of nicotine dependence: Secondary | ICD-10-CM | POA: Insufficient documentation

## 2013-09-02 LAB — GC/CHLAMYDIA PROBE AMP
CT PROBE, AMP APTIMA: POSITIVE — AB
GC PROBE AMP APTIMA: NEGATIVE

## 2013-09-02 MED ORDER — CEFTRIAXONE SODIUM 250 MG IJ SOLR
250.0000 mg | Freq: Once | INTRAMUSCULAR | Status: AC
  Administered 2013-09-02: 250 mg via INTRAMUSCULAR
  Filled 2013-09-02: qty 250

## 2013-09-02 MED ORDER — AZITHROMYCIN 1 G PO PACK
1.0000 g | PACK | Freq: Once | ORAL | Status: AC
  Administered 2013-09-02: 1 g via ORAL
  Filled 2013-09-02: qty 1

## 2013-09-02 NOTE — ED Provider Notes (Signed)
CSN: [id number]     Arrival date & time 09/02/13  0128 History   First MD Initiated Contact with Patient 09/02/13 [phone]     Chief Complaint  Patient presents with  . Dysuria   (Consider location/radiation/quality/duration/timing/severity/associated sxs/prior Treatment) HPI This is a 23 year old man with burning with urination that began yesterday. The symptoms are moderate. He has developed a yellow-green urethral discharge that is dripping into his underwear. He denies any fever, chills, nausea or vomiting. He denies abdominal pain. He states his symptoms are worsening.  Past Medical History  Diagnosis Date  . Asthma   . STD (male)    History reviewed. No pertinent past surgical history. History reviewed. No pertinent family history. History  Substance Use Topics  . Smoking status: Former Games developermoker  . Smokeless tobacco: Not on file  . Alcohol Use: Yes     Comment: ocassionally    Review of Systems  All other systems reviewed and are negative.    Allergies  Review of patient's allergies indicates no known allergies.  Home Medications  No current outpatient prescriptions on file. BP 111/70  Pulse 78  Temp(Src) 98.2 F (36.8 C) (Oral)  Resp 18  Ht 6\' 1"  (1.854 m)  Wt 130 lb (58.968 kg)  BMI 17.16 kg/m2  SpO2 99%  Physical Exam General: Well-developed, well-nourished male in no acute distress; appearance consistent with age of record HENT: normocephalic; atraumatic Eyes: Normal appearance Neck: supple Heart: regular rate and rhythm Lungs: Normal respiratory effort and excursion Abdomen: soft; nondistended; nontender GU: Tanner 4 male, circumcised; urethral discharge Extremities: No deformity; full range of motion Neurologic: Awake, alert and oriented; motor function intact in all extremities and symmetric; no facial droop Skin: Warm and dry Psychiatric: Normal mood and affect    ED Course  Procedures (including critical care time)    MDM      Hanley SeamenJohn L  Esa Raden, MD 09/02/13 [phone]

## 2013-09-02 NOTE — ED Notes (Signed)
Pt reports burning during urination, along with white/yellowish discharge from penis

## 2013-09-03 ENCOUNTER — Telehealth (HOSPITAL_COMMUNITY): Payer: Self-pay

## 2013-09-03 NOTE — ED Notes (Addendum)
Results received from [facility].  (+) Chlamydia. Treated with Zithromax and Rocephin.  [facility] form completed and faxed.  Call and notify patient.   

## 2013-09-05 NOTE — ED Notes (Signed)
Unable to contact patient via phone. Sent letter. °

## 2014-02-01 ENCOUNTER — Encounter (HOSPITAL_BASED_OUTPATIENT_CLINIC_OR_DEPARTMENT_OTHER): Payer: Self-pay

## 2014-02-01 ENCOUNTER — Emergency Department (HOSPITAL_BASED_OUTPATIENT_CLINIC_OR_DEPARTMENT_OTHER): Payer: BC Managed Care – PPO

## 2014-02-01 ENCOUNTER — Emergency Department (HOSPITAL_BASED_OUTPATIENT_CLINIC_OR_DEPARTMENT_OTHER)
Admission: EM | Admit: 2014-02-01 | Discharge: 2014-02-01 | Disposition: A | Discharge status: Home / Self Care | Payer: BC Managed Care – PPO | Attending: Emergency Medicine | Admitting: Emergency Medicine

## 2014-02-01 DIAGNOSIS — T148XXA Other injury of unspecified body region, initial encounter: Secondary | ICD-10-CM | POA: Insufficient documentation

## 2014-02-01 DIAGNOSIS — S46909A Unspecified injury of unspecified muscle, fascia and tendon at shoulder and upper arm level, unspecified arm, initial encounter: Secondary | ICD-10-CM | POA: Insufficient documentation

## 2014-02-01 DIAGNOSIS — IMO0002 Reserved for concepts with insufficient information to code with codable children: Secondary | ICD-10-CM | POA: Insufficient documentation

## 2014-02-01 DIAGNOSIS — S4980XA Other specified injuries of shoulder and upper arm, unspecified arm, initial encounter: Secondary | ICD-10-CM | POA: Insufficient documentation

## 2014-02-01 DIAGNOSIS — J45909 Unspecified asthma, uncomplicated: Secondary | ICD-10-CM | POA: Insufficient documentation

## 2014-02-01 DIAGNOSIS — Z87891 Personal history of nicotine dependence: Secondary | ICD-10-CM | POA: Insufficient documentation

## 2014-02-01 DIAGNOSIS — S139XXA Sprain of joints and ligaments of unspecified parts of neck, initial encounter: Secondary | ICD-10-CM | POA: Insufficient documentation

## 2014-02-01 DIAGNOSIS — Y9389 Activity, other specified: Secondary | ICD-10-CM | POA: Insufficient documentation

## 2014-02-01 DIAGNOSIS — Z8619 Personal history of other infectious and parasitic diseases: Secondary | ICD-10-CM | POA: Insufficient documentation

## 2014-02-01 DIAGNOSIS — Y92410 Unspecified street and highway as the place of occurrence of the external cause: Secondary | ICD-10-CM | POA: Insufficient documentation

## 2014-02-01 DIAGNOSIS — S161XXA Strain of muscle, fascia and tendon at neck level, initial encounter: Secondary | ICD-10-CM

## 2014-02-01 MED ORDER — CYCLOBENZAPRINE HCL 10 MG PO TABS: 10.0000 mg | ORAL_TABLET | Freq: Two times a day (BID) | ORAL | Status: DC | PRN

## 2014-02-01 MED ORDER — IBUPROFEN 600 MG PO TABS: 600.0000 mg | ORAL_TABLET | Freq: Four times a day (QID) | ORAL | Status: DC | PRN

## 2014-02-01 MED ORDER — CYCLOBENZAPRINE HCL 10 MG PO TABS
5.0000 mg | ORAL_TABLET | Freq: Once | ORAL | Status: AC
  Administered 2014-02-01: 5 mg via ORAL

## 2014-02-01 MED ORDER — KETOROLAC TROMETHAMINE 60 MG/2ML IM SOLN: 60.0000 mg | Freq: Once | INTRAMUSCULAR | Status: DC
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
## 2014-04-06 ENCOUNTER — Encounter (HOSPITAL_BASED_OUTPATIENT_CLINIC_OR_DEPARTMENT_OTHER): Payer: Self-pay | Admitting: Emergency Medicine

## 2014-04-06 ENCOUNTER — Emergency Department (HOSPITAL_BASED_OUTPATIENT_CLINIC_OR_DEPARTMENT_OTHER)
Admission: EM | Admit: 2014-04-06 | Discharge: 2014-04-06 | Disposition: A | Discharge status: Home / Self Care | Payer: BC Managed Care – PPO | Attending: Emergency Medicine | Admitting: Emergency Medicine

## 2014-04-06 ENCOUNTER — Emergency Department (HOSPITAL_BASED_OUTPATIENT_CLINIC_OR_DEPARTMENT_OTHER): Payer: BC Managed Care – PPO

## 2014-04-06 DIAGNOSIS — Z8619 Personal history of other infectious and parasitic diseases: Secondary | ICD-10-CM | POA: Insufficient documentation

## 2014-04-06 DIAGNOSIS — R05 Cough: Secondary | ICD-10-CM | POA: Insufficient documentation

## 2014-04-06 DIAGNOSIS — J45909 Unspecified asthma, uncomplicated: Secondary | ICD-10-CM | POA: Insufficient documentation

## 2014-04-06 DIAGNOSIS — R059 Cough, unspecified: Secondary | ICD-10-CM | POA: Insufficient documentation

## 2014-04-06 DIAGNOSIS — J40 Bronchitis, not specified as acute or chronic: Secondary | ICD-10-CM

## 2014-04-06 DIAGNOSIS — Z791 Long term (current) use of non-steroidal anti-inflammatories (NSAID): Secondary | ICD-10-CM | POA: Insufficient documentation

## 2014-04-06 DIAGNOSIS — F17200 Nicotine dependence, unspecified, uncomplicated: Secondary | ICD-10-CM | POA: Insufficient documentation

## 2014-04-06 MED ORDER — HYDROCODONE-HOMATROPINE 5-1.5 MG/5ML PO SYRP: 5.0000 mL | ORAL_SOLUTION | Freq: Four times a day (QID) | ORAL | Status: DC | PRN

## 2014-04-06 MED ORDER — AZITHROMYCIN 250 MG PO TABS: 250.0000 mg | ORAL_TABLET | Freq: Every day | ORAL | Status: DC

## 2014-04-06 MED ORDER — AZITHROMYCIN 250 MG PO TABS
500.0000 mg | ORAL_TABLET | Freq: Once | ORAL | Status: AC
  Administered 2014-04-06: 500 mg via ORAL
  Filled 2014-04-06: qty 2

## 2014-04-06 NOTE — Discharge Instructions (Signed)
Upper Respiratory Infection, Adult An upper respiratory infection (URI) is also known as the common cold. It is often caused by a type of germ (virus). Colds are easily spread (contagious). You can pass it to others by kissing, coughing, sneezing, or drinking out of the same glass. Usually, you get better in 1 or 2 weeks.  HOME CARE   Only take medicine as told by your doctor.  Use a warm mist humidifier or breathe in steam from a hot shower.  Drink enough water and fluids to keep your pee (urine) clear or pale yellow.  Get plenty of rest.  Return to work when your temperature is back to normal or as told by your doctor. You may use a face mask and wash your hands to stop your cold from spreading. GET HELP RIGHT AWAY IF:   After the first few days, you feel you are getting worse.  You have questions about your medicine.  You have chills, shortness of breath, or brown or red spit (mucus).  You have yellow or brown snot (nasal discharge) or pain in the face, especially when you bend forward.  You have a fever, puffy (swollen) neck, pain when you swallow, or white spots in the back of your throat.  You have a bad headache, ear pain, sinus pain, or chest pain.  You have a high-pitched whistling sound when you breathe in and out (wheezing).  You have a lasting cough or cough up blood.  You have sore muscles or a stiff neck. MAKE SURE YOU:   Understand these instructions.  Will watch your condition.  Will get help right away if you are not doing well or get worse. Document Released: 01/22/2008 Document Revised: 10/28/2011 Document Reviewed: 11/10/2013 Exit[employer] Patient Information 2015 [employer]. This information is not intended to replace advice given to you by your health care provider. Make sure you discuss any questions you have with your health care provider.  

## 2014-04-06 NOTE — ED Notes (Signed)
Pt. States that he started having a cough Saturday PM. Pt. Reports productive cough with yellow thick mucous. Reports he noticed some blood in his mucous this AM. Pt reports headache and voice hoarseness. Denies sore throat and N/V/D.  Pt. Has been taking mucinex extra strength length liquid once a day before bed. Pt. States that the medication has not helped at all.

## 2014-04-06 NOTE — ED Provider Notes (Signed)
CSN: [id number]     Arrival date & time 04/06/14  1107 History   First MD Initiated Contact with Patient 04/06/14 1118     Chief Complaint  Patient presents with  . Cough      HPI Pt. States that he started having a cough Saturday PM. Pt. Reports productive cough with yellow thick mucous. Reports he noticed some blood in his mucous this AM. Pt reports headache and voice hoarseness. Denies sore throat and N/V/D. Pt. Has been taking mucinex extra strength length liquid once a day before bed. Pt. States that the medication has not helped at all.   Past Medical History  Diagnosis Date  . Asthma   . STD (male)    History reviewed. No pertinent past surgical history. No family history on file. History  Substance Use Topics  . Smoking status: Current Every Day Smoker    Types: Cigarettes  . Smokeless tobacco: Not on file  . Alcohol Use: Yes     Comment: ocassionally    Review of Systems  All other systems reviewed and are negative  Allergies  Review of patient's allergies indicates no known allergies.  Home Medications   Prior to Admission medications   Medication Sig Start Date End Date Taking? Authorizing Provider  azithromycin (ZITHROMAX Z-PAK) 250 MG tablet Take 1 tablet (250 mg total) by mouth daily. 04/06/14   Nelia Shiobert L Vivika Poythress, MD  cyclobenzaprine (FLEXERIL) 10 MG tablet Take 1 tablet (10 mg total) by mouth 2 (two) times daily as needed for muscle spasms. 02/01/14   Shon Batonourtney F Horton, MD  HYDROcodone-homatropine (HYCODAN) 5-1.5 MG/5ML syrup Take 5 mLs by mouth every 6 (six) hours as needed for cough. 04/06/14   Nelia Shiobert L Toribio Seiber, MD  ibuprofen (ADVIL,MOTRIN) 600 MG tablet Take 1 tablet (600 mg total) by mouth every 6 (six) hours as needed. 02/01/14   Shon Batonourtney F Horton, MD   BP 123/80  Pulse 86  Temp(Src) 98.1 F (36.7 C) (Oral)  Resp 15  Ht 6\' 1"  (1.854 m)  Wt 126 lb (57.153 kg)  BMI 16.63 kg/m2  SpO2 99% Physical Exam Physical Exam  Nursing note and vitals  reviewed. Constitutional: He is oriented to person, place, and time. He appears well-developed and well-nourished. No distress.  HENT:  Head: Normocephalic and atraumatic.  Eyes: Pupils are equal, round, and reactive to light.  Neck: Normal range of motion.  Cardiovascular: Normal rate and intact distal pulses.   Pulmonary/Chest: No respiratory distress.  Abdominal: Normal appearance. He exhibits no distension.  Musculoskeletal: Normal range of motion.  Neurological: He is alert and oriented to person, place, and time. No cranial nerve deficit.  Skin: Skin is warm and dry. No rash noted.  Psychiatric: He has a normal mood and affect. His behavior is normal.   ED Course  Procedures (including critical care time) Labs Review Labs Reviewed - No data to display  Imaging Review No results found.    MDM   Final diagnoses:  Bronchitis        Nelia Shiobert L Anoop Hemmer, MD 04/10/14 [phone]

## 2014-05-01 ENCOUNTER — Emergency Department (HOSPITAL_BASED_OUTPATIENT_CLINIC_OR_DEPARTMENT_OTHER): Payer: BC Managed Care – PPO

## 2014-05-01 ENCOUNTER — Encounter (HOSPITAL_BASED_OUTPATIENT_CLINIC_OR_DEPARTMENT_OTHER): Payer: Self-pay | Admitting: Emergency Medicine

## 2014-05-01 ENCOUNTER — Emergency Department (HOSPITAL_BASED_OUTPATIENT_CLINIC_OR_DEPARTMENT_OTHER)
Admission: EM | Admit: 2014-05-01 | Discharge: 2014-05-01 | Disposition: A | Discharge status: Home / Self Care | Payer: BC Managed Care – PPO | Attending: Emergency Medicine | Admitting: Emergency Medicine

## 2014-05-01 DIAGNOSIS — R51 Headache: Secondary | ICD-10-CM | POA: Diagnosis not present

## 2014-05-01 DIAGNOSIS — J45909 Unspecified asthma, uncomplicated: Secondary | ICD-10-CM | POA: Insufficient documentation

## 2014-05-01 DIAGNOSIS — Z8619 Personal history of other infectious and parasitic diseases: Secondary | ICD-10-CM | POA: Insufficient documentation

## 2014-05-01 DIAGNOSIS — R599 Enlarged lymph nodes, unspecified: Secondary | ICD-10-CM | POA: Diagnosis not present

## 2014-05-01 DIAGNOSIS — B9789 Other viral agents as the cause of diseases classified elsewhere: Secondary | ICD-10-CM | POA: Diagnosis not present

## 2014-05-01 DIAGNOSIS — M549 Dorsalgia, unspecified: Secondary | ICD-10-CM | POA: Diagnosis not present

## 2014-05-01 DIAGNOSIS — R630 Anorexia: Secondary | ICD-10-CM | POA: Insufficient documentation

## 2014-05-01 DIAGNOSIS — B349 Viral infection, unspecified: Secondary | ICD-10-CM

## 2014-05-01 DIAGNOSIS — IMO0001 Reserved for inherently not codable concepts without codable children: Secondary | ICD-10-CM | POA: Diagnosis not present

## 2014-05-01 DIAGNOSIS — Z792 Long term (current) use of antibiotics: Secondary | ICD-10-CM | POA: Insufficient documentation

## 2014-05-01 DIAGNOSIS — F17200 Nicotine dependence, unspecified, uncomplicated: Secondary | ICD-10-CM | POA: Diagnosis not present

## 2014-05-01 DIAGNOSIS — M791 Myalgia, unspecified site: Secondary | ICD-10-CM

## 2014-05-01 DIAGNOSIS — R52 Pain, unspecified: Secondary | ICD-10-CM | POA: Diagnosis present

## 2014-05-01 DIAGNOSIS — R509 Fever, unspecified: Secondary | ICD-10-CM

## 2014-05-01 LAB — URINALYSIS, ROUTINE W REFLEX MICROSCOPIC
BILIRUBIN URINE: NEGATIVE
Glucose, UA: NEGATIVE mg/dL
Hgb urine dipstick: NEGATIVE
Leukocytes, UA: NEGATIVE
NITRITE: NEGATIVE
Protein, ur: NEGATIVE mg/dL
Specific Gravity, Urine: 1.025 (ref 1.005–1.030)
UROBILINOGEN UA: 1 mg/dL (ref 0.0–1.0)
pH: 6.5 (ref 5.0–8.0)

## 2014-05-01 MED ORDER — IBUPROFEN 800 MG PO TABS
800.0000 mg | ORAL_TABLET | Freq: Once | ORAL | Status: AC
  Administered 2014-05-01: 800 mg via ORAL
  Filled 2014-05-01: qty 1

## 2014-05-01 NOTE — Discharge Instructions (Signed)
Fever, Adult A fever is a temperature of 100.4 F (38 C) or above.  HOME CARE  Take fever medicine as told by your doctor. Do not  take aspirin for fever if you are younger than 24 years of age.  If you are given antibiotic medicine, take it as told. Finish the medicine even if you start to feel better.  Rest.  Drink enough fluids to keep your pee (urine) clear or pale yellow. Do not drink alcohol.  Take a bath or shower with room temperature water. Do not use ice water or alcohol sponge baths.  Wear lightweight, loose clothes. GET HELP RIGHT AWAY IF:   You are short of breath or have trouble breathing.  You are very weak.  You are dizzy or you pass out (faint).  You are very thirsty or are making little or no urine.  You have new pain.  You throw up (vomit) or have watery poop (diarrhea).  You keep throwing up or having watery poop for more than 1 to 2 days.  You have a stiff neck or light bothers your eyes.  You have a skin rash.  You have a fever or problems (symptoms) that last for more than 2 to 3 days.  You have a fever and your problems quickly get worse.  You keep throwing up the fluids you drink.  You do not feel better after 3 days.  You have new problems. MAKE SURE YOU:   Understand these instructions.  Will watch your condition.  Will get help right away if you are not doing well or get worse. Document Released: 05/14/2008 Document Revised: 10/28/2011 Document Reviewed: 06/06/2011 [facility] Patient Information 2015 [place], [place]. This information is not intended to replace advice given to you by your health care provider. Make sure you discuss any questions you have with your health care provider.  Viral Infections A viral infection can be caused by different types of viruses.Most viral infections are not serious and resolve on their own. However, some infections may cause severe symptoms and may lead to further complications. SYMPTOMS Viruses  can frequently cause:  Minor sore throat.  Aches and pains.  Headaches.  Runny nose.  Different types of rashes.  Watery eyes.  Tiredness.  Cough.  Loss of appetite.  Gastrointestinal infections, resulting in nausea, vomiting, and diarrhea. These symptoms do not respond to antibiotics because the infection is not caused by bacteria. However, you might catch a bacterial infection following the viral infection. This is sometimes called a "superinfection." Symptoms of such a bacterial infection may include:  Worsening sore throat with pus and difficulty swallowing.  Swollen neck glands.  Chills and a high or persistent fever.  Severe headache.  Tenderness over the sinuses.  Persistent overall ill feeling (malaise), muscle aches, and tiredness (fatigue).  Persistent cough.  Yellow, green, or brown mucus production with coughing. HOME CARE INSTRUCTIONS   Only take over-the-counter or prescription medicines for pain, discomfort, diarrhea, or fever as directed by your caregiver.  Drink enough water and fluids to keep your urine clear or pale yellow. Sports drinks can provide valuable electrolytes, sugars, and hydration.  Get plenty of rest and maintain proper nutrition. Soups and broths with crackers or rice are fine. SEEK IMMEDIATE MEDICAL CARE IF:   You have severe headaches, shortness of breath, chest pain, neck pain, or an unusual rash.  You have uncontrolled vomiting, diarrhea, or you are unable to keep down fluids.  You or your child has an oral temperature  above 102 F (38.9 C), not controlled by medicine.  Your baby is older than 3 months with a rectal temperature of 102 F (38.9 C) or higher.  Your baby is 46 months old or younger with a rectal temperature of 100.4 F (38 C) or higher. MAKE SURE YOU:   Understand these instructions.  Will watch your condition.  Will get help right away if you are not doing well or get worse. Document Released:  05/15/2005 Document Revised: 10/28/2011 Document Reviewed: 12/10/2010 [facility] Patient Information 2015 [place], [place]. This information is not intended to replace advice given to you by your health care provider. Make sure you discuss any questions you have with your health care provider.

## 2014-05-01 NOTE — ED Provider Notes (Signed)
CSN: [id number]     Arrival date & time 05/01/14  1557 History   First MD Initiated Contact with Patient 05/01/14 1711     This chart was scribed for Rolland Porter, MD by Tonye Royalty, ED Scribe. This patient was seen in room MH07/MH07 and the patient's care was started at 5:29 PM.   Chief Complaint  Patient presents with  . Generalized Body Aches   The history is provided by the patient. No language interpreter was used.    HPI Comments: William Sellers is a 23 y.o. male who presents to the Emergency Department complaining of headache, hot and cold flashes, and back pain. He states hot and cold flashes had onset 2 days ago. His temperature is measured at 100.6 at this time. He states his headache and back pain started today. He states his headache is diffuse over his head but worse in the front and he states his back pain is in the mid subscapular area. He reports associated loss of appetite. He denies shakes or sore throat.  Past Medical History  Diagnosis Date  . Asthma   . STD (male)    History reviewed. No pertinent past surgical history. History reviewed. No pertinent family history. History  Substance Use Topics  . Smoking status: Current Every Day Smoker    Types: Cigarettes  . Smokeless tobacco: Not on file  . Alcohol Use: Yes     Comment: ocassionally    Review of Systems  Constitutional: Positive for fever, chills and appetite change. Negative for diaphoresis and fatigue.  HENT: Negative for mouth sores, sore throat and trouble swallowing.   Eyes: Negative for visual disturbance.  Respiratory: Negative for cough, chest tightness, shortness of breath and wheezing.   Cardiovascular: Negative for chest pain.  Gastrointestinal: Negative for nausea, vomiting, abdominal pain, diarrhea and abdominal distention.  Endocrine: Negative for polydipsia, polyphagia and polyuria.  Genitourinary: Negative for dysuria, frequency and hematuria.  Musculoskeletal: Positive for back pain.  Negative for gait problem.  Skin: Negative for color change, pallor and rash.  Neurological: Positive for headaches. Negative for dizziness, syncope and light-headedness.  Hematological: Does not bruise/bleed easily.  Psychiatric/Behavioral: Negative for behavioral problems and confusion.      Allergies  Review of patient's allergies indicates no known allergies.  Home Medications   Prior to Admission medications   Medication Sig Start Date End Date Taking? Authorizing Provider  azithromycin (ZITHROMAX Z-PAK) 250 MG tablet Take 1 tablet (250 mg total) by mouth daily. 04/06/14   Nelia Shi, MD  cyclobenzaprine (FLEXERIL) 10 MG tablet Take 1 tablet (10 mg total) by mouth 2 (two) times daily as needed for muscle spasms. 02/01/14   Shon Baton, MD  HYDROcodone-homatropine (HYCODAN) 5-1.5 MG/5ML syrup Take 5 mLs by mouth every 6 (six) hours as needed for cough. 04/06/14   Nelia Shi, MD  ibuprofen (ADVIL,MOTRIN) 600 MG tablet Take 1 tablet (600 mg total) by mouth every 6 (six) hours as needed. 02/01/14   Shon Baton, MD   BP 133/87  Pulse 91  Temp(Src) 99.4 F (37.4 C) (Oral)  Resp 18  Wt 143 lb (64.864 kg)  SpO2 100% Physical Exam  Nursing note and vitals reviewed. Constitutional: He is oriented to person, place, and time. He appears well-developed and well-nourished. No distress.  HENT:  Head: Normocephalic.  Mouth/Throat: No oropharyngeal exudate.  Eyes: Conjunctivae are normal. Pupils are equal, round, and reactive to light. No scleral icterus.  Neck: Normal range of motion. Neck  supple. No thyromegaly present.  Cardiovascular: Normal rate and regular rhythm.  Exam reveals no gallop and no friction rub.   No murmur heard. Pulmonary/Chest: Effort normal and breath sounds normal. No respiratory distress. He has no wheezes. He has no rales.  Abdominal: Soft. Bowel sounds are normal. He exhibits no distension. There is no tenderness. There is no rebound.   Musculoskeletal: Normal range of motion.  Lymphadenopathy:    He has cervical adenopathy (a few left posterior).  Neurological: He is alert and oriented to person, place, and time.  Skin: Skin is warm and dry. No rash noted.  Psychiatric: He has a normal mood and affect. His behavior is normal.    ED Course  Procedures (including critical care time) Labs Review Labs Reviewed  URINALYSIS, ROUTINE W REFLEX MICROSCOPIC - Abnormal; Notable for the following:    Ketones, ur >80 (*)    All other components within normal limits    Imaging Review Dg Chest 2 View  05/01/2014   CLINICAL DATA:  Fever and chills.  Body aches.  EXAM: CHEST  2 VIEW  COMPARISON:  04/06/2014  FINDINGS: The heart size and mediastinal contours are within normal limits. Minimal apical pleural thickening bilaterally. The lungs are otherwise clear. The visualized skeletal structures are unremarkable.  IMPRESSION: No active cardiopulmonary disease.   Electronically Signed   By: Geanie Cooley M.D.   On: 05/01/2014 18:32     EKG Interpretation None     DIAGNOSTIC STUDIES: Oxygen Saturation is 100% on room air, normal by my interpretation.    COORDINATION OF CARE:    MDM   Final diagnoses:  Viral syndrome  Myalgia  Fever, unspecified fever cause    I personally performed the services described in this documentation, which was scribed in my presence. The recorded information has been reviewed and is accurate.    Rolland Porter, MD 05/01/14 1900

## 2014-05-01 NOTE — ED Notes (Signed)
Pt reports generalized body aches, decrease in appetite and weakness x 3 days. Reports chills

## 2016-01-22 IMAGING — CR DG CHEST 2V
2 series · 2 of 2 positions shown · non-contrast
Comparison: 02/01/2014

CLINICAL DATA: Chest congestion, cough

EXAM:
CHEST  2 VIEW

[w chest pa]
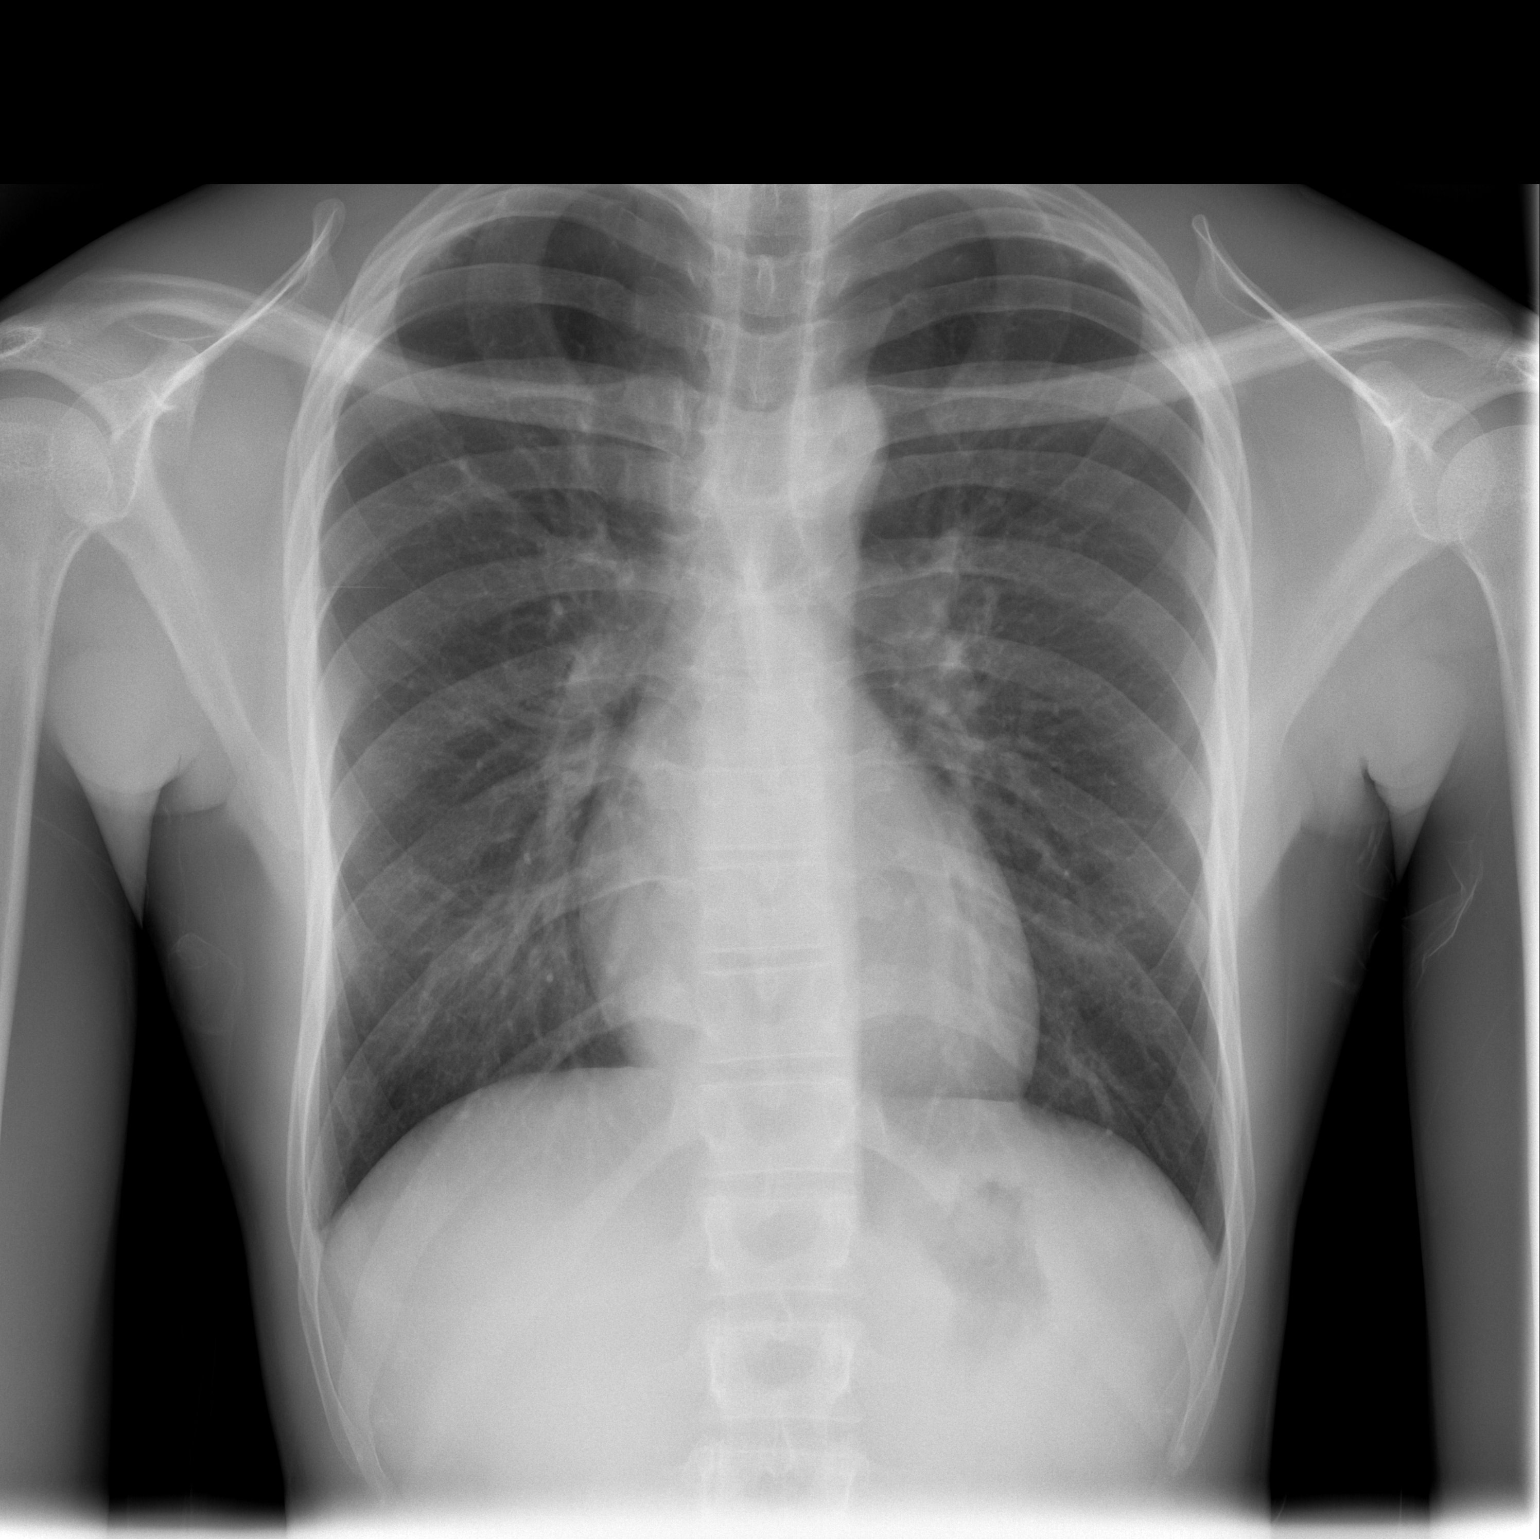

[w chest lat]
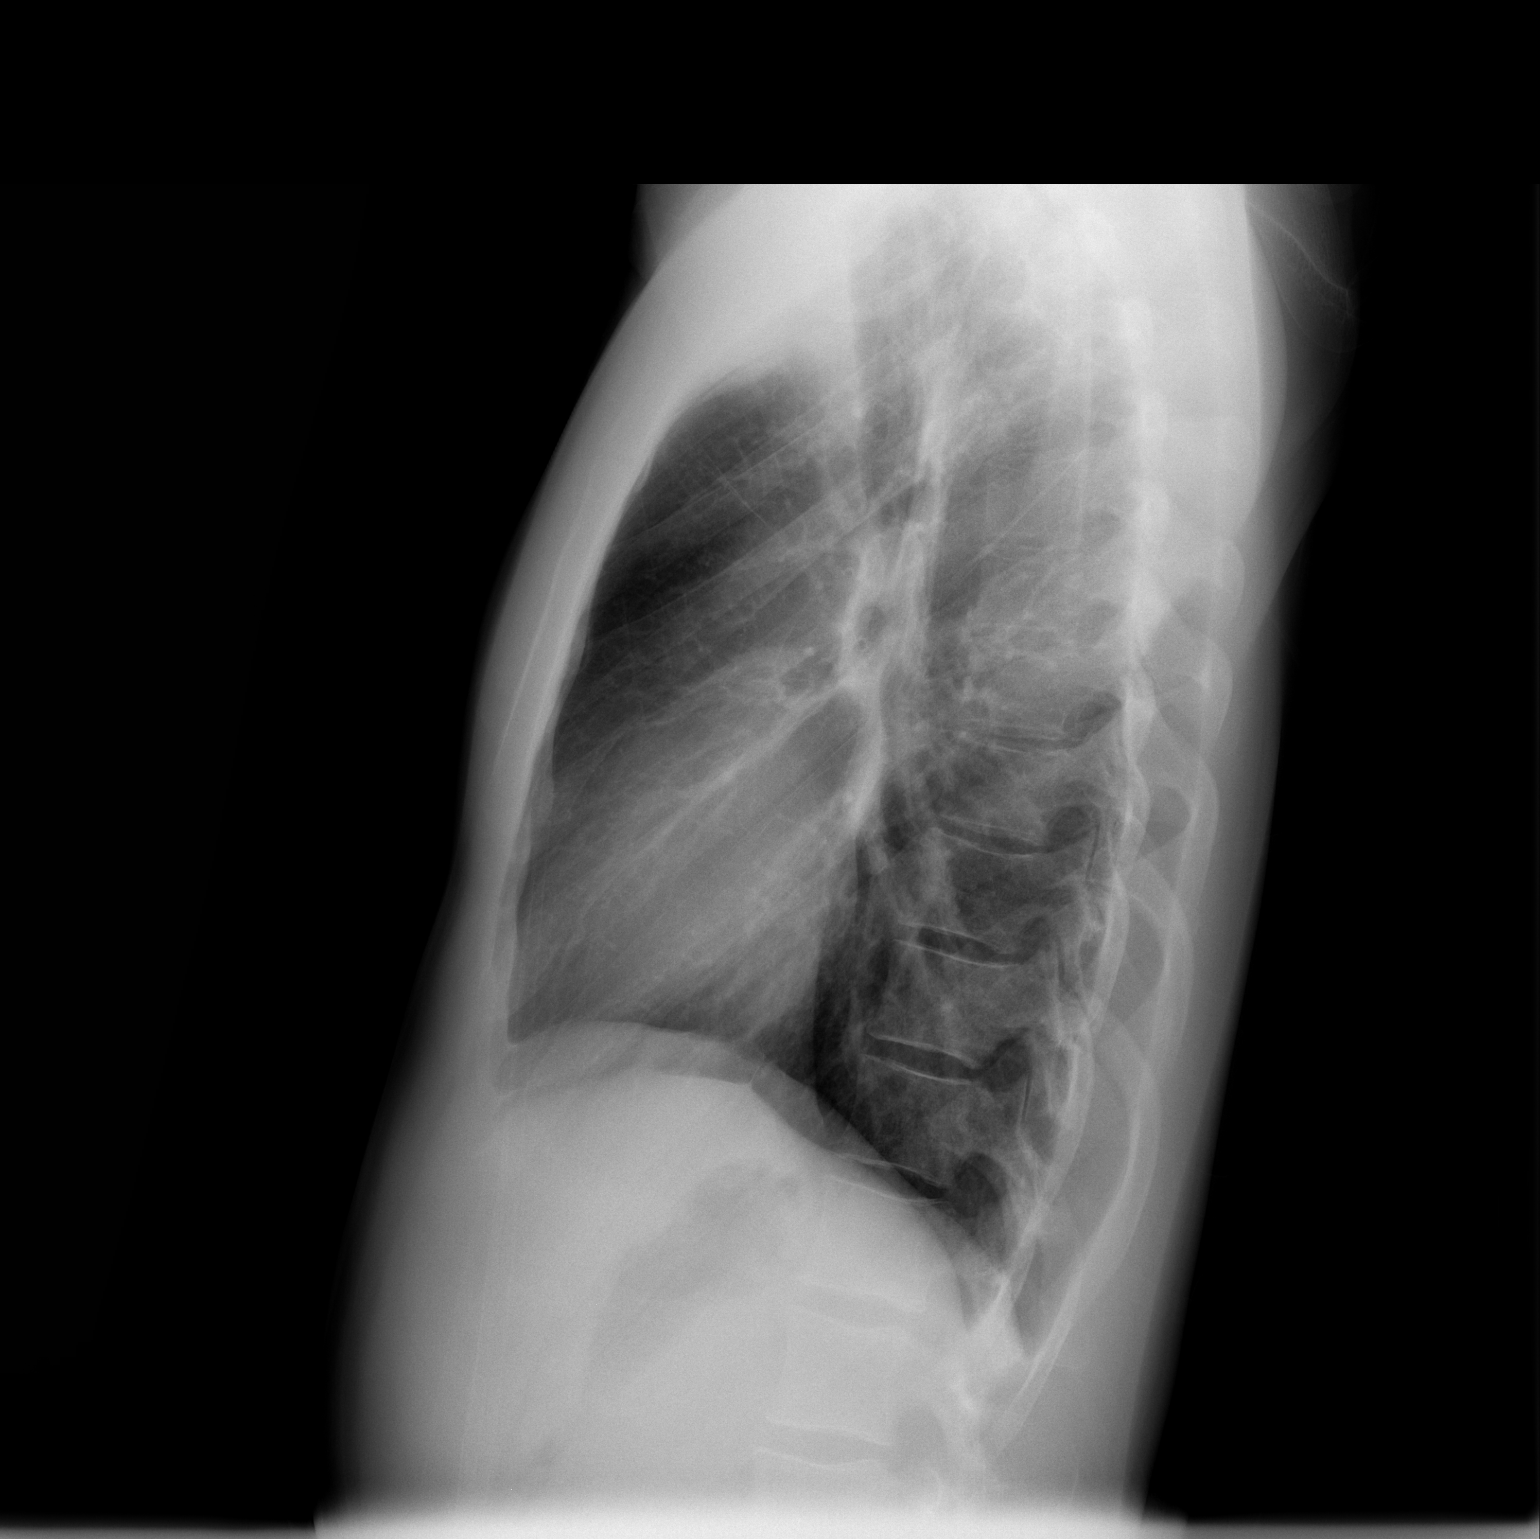

[2 of 2 positions shown; findings below may reference images not displayed]

FINDINGS: The heart size and mediastinal contours are within normal limits.
Both lungs are clear. The visualized skeletal structures are
unremarkable.
IMPRESSION: No active cardiopulmonary disease.

## 2016-11-13 ENCOUNTER — Ambulatory Visit: Payer: Self-pay

## 2016-11-13 ENCOUNTER — Other Ambulatory Visit (INDEPENDENT_AMBULATORY_CARE_PROVIDER_SITE_OTHER): Payer: Self-pay

## 2016-11-13 ENCOUNTER — Other Ambulatory Visit: Payer: Self-pay | Admitting: *Deleted

## 2016-11-13 DIAGNOSIS — Z79899 Other long term (current) drug therapy: Secondary | ICD-10-CM

## 2016-11-13 DIAGNOSIS — Z113 Encounter for screening for infections with a predominantly sexual mode of transmission: Secondary | ICD-10-CM

## 2016-11-13 DIAGNOSIS — B20 Human immunodeficiency virus [HIV] disease: Secondary | ICD-10-CM

## 2016-11-13 LAB — CBC WITH DIFFERENTIAL/PLATELET
BASOS ABS: 0 {cells}/uL (ref 0–200)
BASOS PCT: 0 %
EOS ABS: 126 {cells}/uL (ref 15–500)
EOS PCT: 3 %
HCT: 47.4 % (ref 38.5–50.0)
HEMOGLOBIN: 16.2 g/dL (ref 13.2–17.1)
LYMPHS ABS: 1428 {cells}/uL (ref 850–3900)
Lymphocytes Relative: 34 %
MCH: 30.5 pg (ref 27.0–33.0)
MCHC: 34.2 g/dL (ref 32.0–36.0)
MCV: 89.1 fL (ref 80.0–100.0)
MPV: 9.9 fL (ref 7.5–12.5)
Monocytes Absolute: 210 cells/uL (ref 200–950)
Monocytes Relative: 5 %
NEUTROS ABS: 2436 {cells}/uL (ref 1500–7800)
Neutrophils Relative %: 58 %
PLATELETS: 265 10*3/uL (ref 140–400)
RBC: 5.32 MIL/uL (ref 4.20–5.80)
RDW: 14.6 % (ref 11.0–15.0)
WBC: 4.2 10*3/uL (ref 3.8–10.8)

## 2016-11-13 LAB — LIPID PANEL
CHOLESTEROL: 163 mg/dL (ref <200)
HDL: 72 mg/dL (ref >40)
LDL Cholesterol: 83 mg/dL (ref <100)
TRIGLYCERIDES: 42 mg/dL (ref <150)
Total CHOL/HDL Ratio: 2.3 Ratio (ref <5.0)
VLDL: 8 mg/dL (ref <30)

## 2016-11-13 LAB — COMPLETE METABOLIC PANEL WITH GFR
ALBUMIN: 4.6 g/dL (ref 3.6–5.1)
ALT: 14 U/L (ref 9–46)
AST: 21 U/L (ref 10–40)
Alkaline Phosphatase: 71 U/L (ref 40–115)
BILIRUBIN TOTAL: 0.5 mg/dL (ref 0.2–1.2)
BUN: 9 mg/dL (ref 7–25)
CO2: 26 mmol/L (ref 20–31)
CREATININE: 0.92 mg/dL (ref 0.60–1.35)
Calcium: 9.9 mg/dL (ref 8.6–10.3)
Chloride: 105 mmol/L (ref 98–110)
GFR, Est African American: >89 mL/min (ref >=60)
GFR, Est Non African American: >89 mL/min (ref >=60)
GLUCOSE: 83 mg/dL (ref 65–99)
Potassium: 4.1 mmol/L (ref 3.5–5.3)
SODIUM: 139 mmol/L (ref 135–146)
TOTAL PROTEIN: 7.8 g/dL (ref 6.1–8.1)

## 2016-11-13 MED ORDER — ABACAVIR-DOLUTEGRAVIR-LAMIVUD 600-50-300 MG PO TABS: 1.0000 | ORAL_TABLET | Freq: Every day | ORAL | 0 refills | Status: DC

## 2016-11-14 ENCOUNTER — Encounter: Payer: Self-pay | Admitting: Internal Medicine

## 2016-11-14 LAB — HEPATITIS B SURFACE ANTIGEN: Hepatitis B Surface Ag: NEGATIVE

## 2016-11-14 LAB — HEPATITIS C ANTIBODY: HCV Ab: NEGATIVE

## 2016-11-14 LAB — T-HELPER CELL (CD4) - (RCID CLINIC ONLY)
CD4 % Helper T Cell: 32 % — ABNORMAL LOW (ref 33–55)
CD4 T CELL ABS: 480 /uL (ref 400–2700)

## 2016-11-14 LAB — HEPATITIS B CORE ANTIBODY, TOTAL: Hep B Core Total Ab: NONREACTIVE

## 2016-11-14 LAB — HEPATITIS A ANTIBODY, TOTAL: HEP A TOTAL AB: REACTIVE — AB

## 2016-11-14 LAB — RPR

## 2016-11-14 LAB — HEPATITIS B SURFACE ANTIBODY,QUALITATIVE: Hep B S Ab: POSITIVE — AB

## 2016-11-15 LAB — QUANTIFERON TB GOLD ASSAY (BLOOD)
Interferon Gamma Release Assay: NEGATIVE
QUANTIFERON TB AG MINUS NIL: 0 [IU]/mL
Quantiferon Nil Value: 0.04 IU/mL

## 2016-11-15 LAB — HIV-1 RNA,QN PCR W/REFLEX GENOTYPE
HIV-1 RNA, QN PCR: 1.84 {Log_copies}/mL — AB
HIV-1 RNA, QN PCR: 69 {copies}/mL — AB

## 2016-11-18 ENCOUNTER — Emergency Department (HOSPITAL_BASED_OUTPATIENT_CLINIC_OR_DEPARTMENT_OTHER)
Admission: EM | Admit: 2016-11-18 | Discharge: 2016-11-18 | Disposition: A | Discharge status: Home / Self Care | Payer: Self-pay | Attending: Emergency Medicine | Admitting: Emergency Medicine

## 2016-11-18 DIAGNOSIS — F17210 Nicotine dependence, cigarettes, uncomplicated: Secondary | ICD-10-CM | POA: Insufficient documentation

## 2016-11-18 DIAGNOSIS — N342 Other urethritis: Secondary | ICD-10-CM | POA: Insufficient documentation

## 2016-11-18 MED ORDER — AZITHROMYCIN 250 MG PO TABS
1000.0000 mg | ORAL_TABLET | Freq: Once | ORAL | Status: AC
  Administered 2016-11-18: 1000 mg via ORAL
  Filled 2016-11-18: qty 4

## 2016-11-18 MED ORDER — CEFTRIAXONE SODIUM 250 MG IJ SOLR
250.0000 mg | Freq: Once | INTRAMUSCULAR | Status: AC
  Administered 2016-11-18: 250 mg via INTRAMUSCULAR
  Filled 2016-11-18: qty 250

## 2016-11-18 NOTE — ED Provider Notes (Signed)
[facility] Provider Note: Lowella Dell, MD, FACEP  CSN: [id number] MRN: [id number] ARRIVAL: 11/18/16 at 0627 ROOM: MH01/MH01   CHIEF COMPLAINT  Dysuria   HISTORY OF PRESENT ILLNESS  William Sellers is a 26 y.o. male with a history of HIV and multiple STDs including syphilis and gonorrhea. He is followed by the [facility] infectious disease department and has an appointment next week. He is here with a one-day history of mild to moderate burning with urination as well as a white urethral discharge. Symptoms are consistent with previous episodes of urethritis. He denies fever, chills, abdominal pain, nausea, vomiting or diarrhea.   Past Medical History:  Diagnosis Date  . Asthma   . STD (male)     No past surgical history on file.  No family history on file.  Social History  Substance Use Topics  . Smoking status: Current Every Day Smoker    Types: Cigarettes  . Smokeless tobacco: Not on file  . Alcohol use Yes     Comment: ocassionally    Prior to Admission medications   Medication Sig Start Date End Date Taking? Authorizing Provider  abacavir-dolutegravir-lamiVUDine (TRIUMEQ) 600-50-300 MG tablet Take 1 tablet by mouth daily. 11/13/16   Ginnie Smart, MD  azithromycin (ZITHROMAX Z-PAK) 250 MG tablet Take 1 tablet (250 mg total) by mouth daily. 04/06/14   Nelva Nay, MD  cyclobenzaprine (FLEXERIL) 10 MG tablet Take 1 tablet (10 mg total) by mouth 2 (two) times daily as needed for muscle spasms. 02/01/14   Shon Baton, MD  HYDROcodone-homatropine (HYCODAN) 5-1.5 MG/5ML syrup Take 5 mLs by mouth every 6 (six) hours as needed for cough. 04/06/14   Nelva Nay, MD  ibuprofen (ADVIL,MOTRIN) 600 MG tablet Take 1 tablet (600 mg total) by mouth every 6 (six) hours as needed. 02/01/14   Shon Baton, MD    Allergies Patient has no known allergies.   REVIEW OF SYSTEMS  Negative except as noted here or in the History of Present Illness.   PHYSICAL  EXAMINATION  Initial Vital Signs Blood pressure 138/79, pulse (!) 58, temperature 98 F (36.7 C), temperature source Oral, resp. rate 16, height  (1.854 m), weight 144 lb (65.3 kg), SpO2 100 %.  Examination General: Well-developed, well-nourished male in no acute distress; appearance consistent with age of record HENT: normocephalic; atraumatic Eyes: pupils equal, round and reactive to light; extraocular muscles intact Neck: supple Heart: regular rate and rhythm Lungs: clear to auscultation bilaterally Abdomen: soft; nondistended; nontender; no masses or hepatosplenomegaly; bowel sounds present GU: Tanner 5 male, circumcised; no urethral discharge seen; urethra swabbed for GC and Chlamydia Extremities: No deformity; full range of motion; pulses normal Neurologic: Awake, alert and oriented; motor function intact in all extremities and symmetric; no facial droop Skin: Warm and dry Psychiatric: Normal mood and affect   RESULTS  Summary of this visit's results, reviewed by myself:   EKG Interpretation  Date/Time:    Ventricular Rate:    PR Interval:    QRS Duration:   QT Interval:    QTC Calculation:   R Axis:     Text Interpretation:        Laboratory Studies: No results found for this or any previous visit (from the past 24 hour(s)). Imaging Studies: No results found.  ED COURSE  Nursing notes and initial vitals signs, including pulse oximetry, reviewed.  Vitals:   11/18/16 0647 11/18/16 0653  BP: 138/79   Pulse: (!) 58  Resp: 16   Temp: 98 F (36.7 C)   TempSrc: Oral   SpO2: 100%   Weight:  144 lb (65.3 kg)  Height:   (1.854 m)    PROCEDURES    ED DIAGNOSES     ICD-9-CM ICD-10-CM   1. Urethritis 597.80 N34.2        Paula Libra, MD 11/18/16 [phone]

## 2016-11-18 NOTE — ED Notes (Signed)
EDP at [facility], urethral swab obtained, chaperone present.

## 2016-11-18 NOTE — ED Triage Notes (Signed)
c/o dysuria and penile d/c, onset yesterday, last intercourse last week. (denies: nvd, fever, dizziness, back or abd pain). Alert, NAD, calm, interactive, resps e/u, speaking in clear complete sentences, no dyspnea noted, skin W&D, VSS.

## 2016-11-19 LAB — GC/CHLAMYDIA PROBE AMP (~~LOC~~) NOT AT ARMC
CHLAMYDIA, DNA PROBE: NEGATIVE
Neisseria Gonorrhea: POSITIVE — AB

## 2016-11-21 ENCOUNTER — Encounter: Payer: Self-pay | Admitting: Infectious Diseases

## 2016-11-21 DIAGNOSIS — B20 Human immunodeficiency virus [HIV] disease: Secondary | ICD-10-CM | POA: Insufficient documentation

## 2016-11-21 DIAGNOSIS — Z789 Other specified health status: Secondary | ICD-10-CM | POA: Insufficient documentation

## 2016-11-21 LAB — HLA B*5701: HLA-B*5701 w/rflx HLA-B High: NEGATIVE

## 2016-12-02 ENCOUNTER — Encounter: Payer: Self-pay | Admitting: Internal Medicine

## 2016-12-02 ENCOUNTER — Ambulatory Visit (INDEPENDENT_AMBULATORY_CARE_PROVIDER_SITE_OTHER): Payer: Self-pay | Admitting: Internal Medicine

## 2016-12-02 VITALS — BP 124/84 | HR 69 | Temp 98.4°F | Ht 73.0 in | Wt 132.0 lb

## 2016-12-02 DIAGNOSIS — B20 Human immunodeficiency virus [HIV] disease: Secondary | ICD-10-CM

## 2016-12-02 DIAGNOSIS — Z23 Encounter for immunization: Secondary | ICD-10-CM

## 2016-12-02 MED ORDER — ABACAVIR-DOLUTEGRAVIR-LAMIVUD 600-50-300 MG PO TABS: 1.0000 | ORAL_TABLET | Freq: Every day | ORAL | 11 refills | Status: DC

## 2016-12-02 NOTE — Progress Notes (Signed)
Patient ID: William Sellers, male    DOB: [birth year], 26 y.o.   MRN: [id number]  Reason for visit: to establish care as a new patient with HIV  HPI:   Patient was first diagnosed in 2016 while in jail.  He was tested as part screening.  Previously tested negative earlier in 2016.  The CD4 count is 480, viral load 69 copies.  There have been no associated symptoms including no n/v/d.  Denies any missed doses.  Started on Triumeq at the time of diagnosis and has been on it since. Living with his brother now, interested in case management.  No weight loss.  Recently started smoking.  Had multiple vaccines in jail but no record available.   Recently diagnosed with and treated for gonorrhea and chlamydia.  Was syptomatic with burning with urination.  No symptoms now.    Past Medical History:  Diagnosis Date  . Asthma   . STD (male)     Prior to Admission medications   Medication Sig Start Date End Date Taking? Authorizing Provider  abacavir-dolutegravir-lamiVUDine (TRIUMEQ) 600-50-300 MG tablet Take 1 tablet by mouth daily. 11/13/16  Yes Ginnie Smart, MD    No Known Allergies  Social History  Substance Use Topics  . Smoking status: Current Every Day Smoker    Types: Cigarettes    Start date: 08/19/2014  . Smokeless tobacco: Never Used  . Alcohol use No   FH: no renal disease  Review of Systems Gastrointestinal: negative for nausea and diarrhea Integument/breast: negative for rash and pruritus Musculoskeletal: negative for myalgias and arthralgias All other systems reviewed and are negative    CONSTITUTIONAL:in no apparent distress and alert  Vitals:   12/02/16 0901  BP: 124/84  Pulse: 69  Temp: 98.4 F (36.9 C)   EYES: anicteric HENT: no thrush CARD:Cor RRR RESP:CTA B; normal respiratory effort BD:ZHGDJ sounds are normal, liver is not enlarged, spleen is not enlarged MS:no pedal edema noted SKIN:no rashes NEURO: non-focal  Assessment: new patient here with established  HIV.  Discussed with patient treatment options and side effects, benefits of treatment, long term outcomes.  I discussed the severity of untreated HIV including higher cancer risk, opportunistic infections, renal failure.  Also discussed needing to use condoms, partner disclosure, necessary vaccines, blood monitoring.  All questions answered.  I also discussed smoking cessation.     Plan: 1) continue Triumeq 2) STI counseling, condoms offered 3) smoking cessation 4) information regarding THP, dental clinic, counseling 5) rtc 3 months with labs prior

## 2016-12-02 NOTE — Addendum Note (Signed)
Addended by: Wendall Mola A on: 12/02/2016 11:15 AM   Modules accepted: Orders

## 2016-12-03 ENCOUNTER — Telehealth: Payer: Self-pay | Admitting: *Deleted

## 2016-12-03 NOTE — Telephone Encounter (Signed)
Patient called to ask if the Triumeq he takes or the [facility] vaccine could have given him a false positive drug test for PCP. Spoke to [employer], pharmacist and there is nothing in these medications to cause this. William Sellers

## 2016-12-17 ENCOUNTER — Encounter (HOSPITAL_BASED_OUTPATIENT_CLINIC_OR_DEPARTMENT_OTHER): Payer: Self-pay | Admitting: Emergency Medicine

## 2016-12-17 ENCOUNTER — Emergency Department (HOSPITAL_BASED_OUTPATIENT_CLINIC_OR_DEPARTMENT_OTHER)
Admission: EM | Admit: 2016-12-17 | Discharge: 2016-12-17 | Disposition: A | Discharge status: Home / Self Care | Payer: Self-pay | Attending: Emergency Medicine | Admitting: Emergency Medicine

## 2016-12-17 DIAGNOSIS — F17210 Nicotine dependence, cigarettes, uncomplicated: Secondary | ICD-10-CM | POA: Insufficient documentation

## 2016-12-17 DIAGNOSIS — Z21 Asymptomatic human immunodeficiency virus [HIV] infection status: Secondary | ICD-10-CM | POA: Insufficient documentation

## 2016-12-17 DIAGNOSIS — J45909 Unspecified asthma, uncomplicated: Secondary | ICD-10-CM | POA: Insufficient documentation

## 2016-12-17 DIAGNOSIS — R300 Dysuria: Secondary | ICD-10-CM | POA: Insufficient documentation

## 2016-12-17 MED ORDER — PHENAZOPYRIDINE HCL 200 MG PO TABS: 200.0000 mg | ORAL_TABLET | Freq: Three times a day (TID) | ORAL | 0 refills | Status: DC

## 2016-12-17 MED ORDER — PHENAZOPYRIDINE HCL 100 MG PO TABS
200.0000 mg | ORAL_TABLET | Freq: Once | ORAL | Status: AC
  Administered 2016-12-17: 200 mg via ORAL
  Filled 2016-12-17: qty 2

## 2016-12-17 NOTE — ED Provider Notes (Signed)
[facility] Provider Note   CSN: [id number] Arrival date & time: 12/17/16  0043     History   Chief Complaint Chief Complaint  Patient presents with  . Burning with urination    HPI William Sellers is a 26 y.o. male.  The history is provided by the patient.  Dysuria   This is a recurrent problem. The current episode started more than 2 days ago. The problem occurs every urination. The problem has been rapidly worsening. The pain is mild. There is no history of pyelonephritis. Pertinent negatives include no vomiting, no discharge, no frequency, no hematuria, no urgency and no flank pain. He has tried nothing for the symptoms. His past medical history does not include kidney stones.  Seen at [facility] within 12 hours and urine was tested for UTI and GC and chlamydia all tests were negative and patient states he needs his parole officer to know how he is doing.  No f/c/r.    Past Medical History:  Diagnosis Date  . Asthma   . STD (male)     Patient Active Problem List   Diagnosis Date Noted  . Human immunodeficiency virus I infection (HCC) 11/21/2016  . Hepatitis B immune 11/21/2016  . Hepatitis A immune 11/21/2016    History reviewed. No pertinent surgical history.     Home Medications    Prior to Admission medications   Medication Sig Start Date End Date Taking? Authorizing Provider  abacavir-dolutegravir-lamiVUDine (TRIUMEQ) 600-50-300 MG tablet Take 1 tablet by mouth daily. 12/02/16   Gardiner Barefoot, MD    Family History History reviewed. No pertinent family history.  Social History Social History  Substance Use Topics  . Smoking status: Current Every Day Smoker    Types: Cigarettes    Start date: 08/19/2014  . Smokeless tobacco: Never Used  . Alcohol use No     Allergies   Patient has no known allergies.   Review of Systems Review of Systems  Constitutional: Negative for fever.  Gastrointestinal: Negative for vomiting.  Genitourinary: Positive  for dysuria. Negative for difficulty urinating, discharge, flank pain, frequency, genital sores, hematuria, penile pain, penile swelling, scrotal swelling, testicular pain and urgency.  Musculoskeletal: Negative for neck stiffness.  All other systems reviewed and are negative.    Physical Exam Updated Vital Signs BP 116/70   Pulse 82   Temp 98.6 F (37 C) (Oral)   Resp 18   Ht  (1.854 m)   Wt 145 lb (65.8 kg)   SpO2 97%   BMI 19.13 kg/m   Physical Exam  Constitutional: He is oriented to person, place, and time. He appears well-developed and well-nourished. No distress.  HENT:  Head: Normocephalic and atraumatic.  Mouth/Throat: No oropharyngeal exudate.  Eyes: Conjunctivae are normal. Pupils are equal, round, and reactive to light.  Neck: Normal range of motion. Neck supple.  Cardiovascular: Normal rate, regular rhythm, normal heart sounds and intact distal pulses.   Pulmonary/Chest: Effort normal and breath sounds normal. He has no wheezes. He has no rales.  Abdominal: Soft. Bowel sounds are normal. He exhibits no mass. There is no tenderness. There is no rebound and no guarding.  Musculoskeletal: Normal range of motion.  Neurological: He is alert and oriented to person, place, and time.  Skin: Skin is warm and dry. Capillary refill takes less than 2 seconds.  Psychiatric: He has a normal mood and affect.     ED Treatments / Results   Vitals:   12/17/16 0050  BP: 116/70  Pulse: 82  Resp: 18  Temp: 98.6 F (37 C)    Procedures Procedures (including critical care time)  Medications Ordered in ED Medications  phenazopyridine (PYRIDIUM) tablet 200 mg (200 mg Oral Given 12/17/16 0115)    See care everywhere for results of urine and GC and chlamydia testing from earlier in the day.   Final Clinical Impressions(s) / ED Diagnoses   Given results from clean catch urine were negative as was GC and chlamydia, will refer patient to urology and back to infectious  disease for further testing.  Return immediately for drainage, fever > 101 or any concerns.  Patient and family verbalize understanding and agree to follow up.  I have reviewed the triage vital signs and the nursing notes. Pertinent labs &imaging results that were available during my care of the patient were reviewed by me and considered in my medical decision making (see chart for details). The patient is nontoxic-appearing on exam and vital signs are within normal limits.    After history, exam, and medical workup I feel the patient has been appropriately medically screened and is safe for discharge home. Pertinent diagnoses were discussed with the patient. Patient was given return precautions.    Cy Blamer, MD 12/17/16 [phone]

## 2016-12-17 NOTE — ED Notes (Signed)
ED Provider at bedside. 

## 2016-12-19 ENCOUNTER — Encounter: Payer: Self-pay | Admitting: *Deleted

## 2017-01-19 ENCOUNTER — Encounter (HOSPITAL_BASED_OUTPATIENT_CLINIC_OR_DEPARTMENT_OTHER): Payer: Self-pay

## 2017-01-19 ENCOUNTER — Emergency Department (HOSPITAL_BASED_OUTPATIENT_CLINIC_OR_DEPARTMENT_OTHER)
Admission: EM | Admit: 2017-01-19 | Discharge: 2017-01-19 | Disposition: A | Discharge status: Home / Self Care | Payer: Self-pay | Attending: Emergency Medicine | Admitting: Emergency Medicine

## 2017-01-19 DIAGNOSIS — K6289 Other specified diseases of anus and rectum: Secondary | ICD-10-CM

## 2017-01-19 DIAGNOSIS — K600 Acute anal fissure: Secondary | ICD-10-CM | POA: Insufficient documentation

## 2017-01-19 DIAGNOSIS — B20 Human immunodeficiency virus [HIV] disease: Secondary | ICD-10-CM | POA: Insufficient documentation

## 2017-01-19 DIAGNOSIS — K602 Anal fissure, unspecified: Secondary | ICD-10-CM

## 2017-01-19 DIAGNOSIS — Z7983 Long term (current) use of bisphosphonates: Secondary | ICD-10-CM | POA: Insufficient documentation

## 2017-01-19 DIAGNOSIS — R1030 Lower abdominal pain, unspecified: Secondary | ICD-10-CM

## 2017-01-19 DIAGNOSIS — F17210 Nicotine dependence, cigarettes, uncomplicated: Secondary | ICD-10-CM | POA: Insufficient documentation

## 2017-01-19 DIAGNOSIS — J45909 Unspecified asthma, uncomplicated: Secondary | ICD-10-CM | POA: Insufficient documentation

## 2017-01-19 HISTORY — DX: Human immunodeficiency virus (HIV) disease: B20

## 2017-01-19 HISTORY — DX: Asymptomatic human immunodeficiency virus (hiv) infection status: Z21

## 2017-01-19 LAB — URINALYSIS, ROUTINE W REFLEX MICROSCOPIC
Bilirubin Urine: NEGATIVE
Glucose, UA: NEGATIVE mg/dL
HGB URINE DIPSTICK: NEGATIVE
Ketones, ur: NEGATIVE mg/dL
LEUKOCYTES UA: NEGATIVE
NITRITE: NEGATIVE
PROTEIN: NEGATIVE mg/dL
SPECIFIC GRAVITY, URINE: 1.021 (ref 1.005–1.030)
pH: 7.5 (ref 5.0–8.0)

## 2017-01-19 MED ORDER — AZITHROMYCIN 250 MG PO TABS
1000.0000 mg | ORAL_TABLET | Freq: Once | ORAL | Status: AC
  Administered 2017-01-19: 1000 mg via ORAL
  Filled 2017-01-19: qty 4

## 2017-01-19 MED ORDER — CEFTRIAXONE SODIUM 250 MG IJ SOLR
250.0000 mg | Freq: Once | INTRAMUSCULAR | Status: AC
  Administered 2017-01-19: 250 mg via INTRAMUSCULAR
  Filled 2017-01-19: qty 250

## 2017-01-19 NOTE — Discharge Instructions (Signed)
Please obtain a stool softener such as Docusate sodium capsules and eat a high-fiber diet. This will decrease your difficulty and pain of passing bowel movements.  Today you have been treated for gonorrhea and chlamydia.  The test to determine if you have these will take a few days. They will only call you if your tests come back positive, no news is good news. In the result that your tests are positive you have already been treated.   If at any point your symptoms worsen, you develop fevers, or have any new or concerning symptoms please seek additional medical care and repeat evaluation.

## 2017-01-19 NOTE — ED Notes (Signed)
Patient denies abdominal pain at present but states that when he has a bowel movement he has rectal pain.

## 2017-01-19 NOTE — ED Provider Notes (Signed)
[facility]   CSN: [id number] Arrival date & time: 01/19/17  1110     History   Chief Complaint Chief Complaint  Patient presents with  . Rectal Pain    HPI William Sellers is a 26 y.o. male who presents with rectal pain. He reports that for the past 4 days he has been experiencing rectal pain that is worse with defecation.  He denies any recent rectal trauma, states that he is sexually active with both men and women however reports that he does not have receptive anal sex or rectal insertion of objects.  He denies dysuria or penile discharge, states that he has some mild bilateral lower quadrant abdominal pain. He denies fevers, nausea, vomiting or diarrhea.   He denies recent sick contacts, travel, or eating questionable food.  HPI  Past Medical History:  Diagnosis Date  . Asthma   . HIV (human immunodeficiency virus infection) (HCC)   . STD (male)     Patient Active Problem List   Diagnosis Date Noted  . Human immunodeficiency virus I infection (HCC) 11/21/2016  . Hepatitis B immune 11/21/2016  . Hepatitis A immune 11/21/2016    History reviewed. No pertinent surgical history.     Home Medications    Prior to Admission medications   Medication Sig Start Date End Date Taking? Authorizing Provider  abacavir-dolutegravir-lamiVUDine (TRIUMEQ) 600-50-300 MG tablet Take 1 tablet by mouth daily. 12/02/16  Yes Comer, Belia Hemanobert W, MD  phenazopyridine (PYRIDIUM) 200 MG tablet Take 1 tablet (200 mg total) by mouth 3 (three) times daily. 12/17/16  Yes Palumbo, April, MD    Family History History reviewed. No pertinent family history.  Social History Social History  Substance Use Topics  . Smoking status: Current Every Day Smoker    Types: Cigarettes    Start date: 08/19/2014  . Smokeless tobacco: Never Used  . Alcohol use Yes     Comment: occasional     Allergies   Patient has no known allergies.   Review of Systems Review of Systems    Constitutional: Negative for chills, diaphoresis, fatigue and fever.  HENT: Negative for ear pain and sore throat.   Eyes: Negative for pain and visual disturbance.  Respiratory: Negative for cough and shortness of breath.   Cardiovascular: Negative for chest pain and palpitations.  Gastrointestinal: Positive for abdominal pain, anal bleeding and rectal pain. Negative for abdominal distention, blood in stool, constipation, diarrhea, nausea and vomiting.  Genitourinary: Negative for dysuria and hematuria.  Musculoskeletal: Negative for arthralgias and back pain.  Skin: Negative for color change and rash.  Neurological: Negative for seizures and syncope.  All other systems reviewed and are negative.    Physical Exam Updated Vital Signs BP (!) 126/97   Pulse 62   Temp 98.7 F (37.1 C) (Oral)   Resp 18   Ht 6\' 1"  (1.854 m)   Wt 66.7 kg (147 lb)   SpO2 100%   BMI 19.39 kg/m   Physical Exam  Constitutional: Vital signs are normal. He appears well-developed and well-nourished. No distress.  HENT:  Head: Normocephalic and atraumatic.  Eyes: Conjunctivae are normal. Right eye exhibits no discharge. Left eye exhibits no discharge. No scleral icterus.  Neck: Normal range of motion.  Cardiovascular: Normal rate and regular rhythm.   Pulmonary/Chest: Effort normal. No stridor. No respiratory distress.  Abdominal: Soft. Normal appearance and bowel sounds are normal. He exhibits no distension and no mass. There is no tenderness. There is no rebound  and no guarding.  Pain does not increase with palpation.  Genitourinary: Rectal exam shows fissure and tenderness. Rectal exam shows no external hemorrhoid, no internal hemorrhoid, no mass and anal tone normal.  Genitourinary Comments: Rectal exam performed with anal fissure/tear present at 12:00, unable to adequately palpate/examine remainder of rectum as patient did not tolerate exam well secondary to increased pain.  Patient declined penile  exam.  Musculoskeletal: He exhibits no edema or deformity.  Neurological: He is alert. He exhibits normal muscle tone.  Skin: Skin is warm and dry. He is not diaphoretic.  Psychiatric: He has a normal mood and affect. His behavior is normal.  Nursing note and vitals reviewed.    ED Treatments / Results  Labs (all labs ordered are listed, but only abnormal results are displayed) Labs Reviewed  URINALYSIS, ROUTINE W REFLEX MICROSCOPIC  GC/CHLAMYDIA PROBE AMP ([place]) NOT AT [facility]    EKG  EKG Interpretation None       Radiology No results found.  Procedures Procedures (including critical care time)  Medications Ordered in ED Medications  cefTRIAXone (ROCEPHIN) injection 250 mg (250 mg Intramuscular Given 01/19/17 1403)  azithromycin (ZITHROMAX) tablet 1,000 mg (1,000 mg Oral Given 01/19/17 1403)     Initial Impression / Assessment and Plan / ED Course  I have reviewed the triage vital signs and the nursing notes.  Pertinent labs & imaging results that were available during my care of the patient were reviewed by me and considered in my medical decision making (see chart for details).    William Haste presents with rectal pain that worsens with defecation consistent with anal fissure. Based on his history cannot exclude proctitis. Rectal swabs were obtained for gonorrhea/Chlamydia cultures and patient was treated with Rocephin and azithromycin. Patient is aware that he has cultures pending for gonorrhea and chlamydia.  Patient was offered abdominal pain based labs, however he says that he is due to have routine labs drawn in a few weeks and does not want to be stuck here today.  Suspect that his abdominal pain is secondary to mild constipation due to concern of associated pain with defecation.  Patient is nontoxic, nonseptic appearing, in no apparent distress. On exam patient does not have a surgical abdomen and there are no peritoneal signs.  Patient was given the opportunity  to ask questions, all of which were answered to the best of my ability.  Patient was instructed to follow up with his primary care provider regarding his symptoms today. Patient was given return precautions and stated his understanding.  Patient was given instructions on sitz baths and instructed to eat a high-fiber diet, and take stool softeners/MiraLAX.      At this time there does not appear to be any evidence of an acute emergency medical condition and the patient appears stable for discharge with appropriate outpatient follow up.Diagnosis was discussed with patient who verbalizes understanding and is agreeable to discharge. Pt case discussed with Dr. Karma Ganja who agrees with my plan.    Final Clinical Impressions(s) / ED Diagnoses   Final diagnoses:  Anal fissure  Lower abdominal pain  Anal pain  Proctitis    New Prescriptions Discharge Medication List as of 01/19/2017  2:21 PM       Cristina Gong, PA-C 01/19/17 1945    Jerelyn Scott, MD 01/22/17 2258

## 2017-01-19 NOTE — ED Triage Notes (Addendum)
PT reports intermittent generalized abdominal pain x2 days - reports pain with defecation. Denies dysuria or penile discharge. Pt states pain worse in rectal area, pt does have unprotected sex, but denies anal intercourse.

## 2017-01-20 ENCOUNTER — Telehealth (HOSPITAL_BASED_OUTPATIENT_CLINIC_OR_DEPARTMENT_OTHER): Payer: Self-pay | Admitting: Emergency Medicine

## 2017-01-20 LAB — GC/CHLAMYDIA PROBE AMP (~~LOC~~) NOT AT ARMC
Chlamydia: POSITIVE — AB
Neisseria Gonorrhea: POSITIVE — AB

## 2017-01-20 NOTE — Telephone Encounter (Signed)
William Sellers with Pathology called about Red [place] tube being sent to the lab with a fox swab tip. No order was found nor is the lab able to run test on this tube.

## 2017-02-06 ENCOUNTER — Other Ambulatory Visit (INDEPENDENT_AMBULATORY_CARE_PROVIDER_SITE_OTHER): Payer: Self-pay

## 2017-02-06 DIAGNOSIS — B20 Human immunodeficiency virus [HIV] disease: Secondary | ICD-10-CM

## 2017-02-07 LAB — T-HELPER CELL (CD4) - (RCID CLINIC ONLY)
CD4 T CELL HELPER: 32 % — AB (ref 33–55)
CD4 T Cell Abs: 420 /uL (ref 400–2700)

## 2017-02-11 LAB — HIV-1 RNA QUANT-NO REFLEX-BLD
HIV 1 RNA Quant: <20 copies/mL — AB
HIV-1 RNA Quant, Log: <1.3 Log copies/mL — AB

## 2017-02-20 ENCOUNTER — Encounter: Payer: Self-pay | Admitting: Internal Medicine

## 2017-02-20 ENCOUNTER — Ambulatory Visit (INDEPENDENT_AMBULATORY_CARE_PROVIDER_SITE_OTHER): Payer: Self-pay | Admitting: Internal Medicine

## 2017-02-20 VITALS — BP 137/82 | HR 52 | Temp 98.5°F | Ht 73.0 in | Wt 134.0 lb

## 2017-02-20 DIAGNOSIS — B20 Human immunodeficiency virus [HIV] disease: Secondary | ICD-10-CM

## 2017-02-20 DIAGNOSIS — Z720 Tobacco use: Secondary | ICD-10-CM | POA: Insufficient documentation

## 2017-02-20 NOTE — Assessment & Plan Note (Signed)
Counseled on quitting °

## 2017-02-20 NOTE — Assessment & Plan Note (Signed)
Doing well on this.  No changes.  rtc 6 months with labs prior

## 2017-02-20 NOTE — Progress Notes (Signed)
   Subjective:    Patient ID: William Sellers, male    DOB: 1991/06/30, 26 y.o.   MRN: [id number]  HPI Here for follow up of HIV>  He was a new patient last visit already on Triumeq that was started while he was in jail.  No missed doses and no side effects.  No associated n/v/d.     Review of Systems  Constitutional: Negative for fatigue.  Gastrointestinal: Negative for diarrhea.  Skin: Negative for rash.  Neurological: Negative for dizziness.       Objective:   Physical Exam  Constitutional: He appears well-developed and well-nourished. No distress.  HENT:  Mouth/Throat: No oropharyngeal exudate.  Eyes: No scleral icterus.  Cardiovascular: Normal rate, regular rhythm and normal heart sounds.   No murmur heard. Pulmonary/Chest: Effort normal and breath sounds normal. No respiratory distress.  Lymphadenopathy:    He has no cervical adenopathy.  Skin: No rash noted.          Assessment & Plan:

## 2017-02-26 ENCOUNTER — Encounter: Payer: Self-pay | Admitting: Internal Medicine

## 2017-03-03 ENCOUNTER — Emergency Department (HOSPITAL_BASED_OUTPATIENT_CLINIC_OR_DEPARTMENT_OTHER)
Admission: EM | Admit: 2017-03-03 | Discharge: 2017-03-03 | Disposition: A | Discharge status: Home / Self Care | Payer: Self-pay | Attending: Emergency Medicine | Admitting: Emergency Medicine

## 2017-03-03 ENCOUNTER — Encounter (HOSPITAL_BASED_OUTPATIENT_CLINIC_OR_DEPARTMENT_OTHER): Payer: Self-pay | Admitting: *Deleted

## 2017-03-03 DIAGNOSIS — J45909 Unspecified asthma, uncomplicated: Secondary | ICD-10-CM | POA: Insufficient documentation

## 2017-03-03 DIAGNOSIS — Y999 Unspecified external cause status: Secondary | ICD-10-CM | POA: Insufficient documentation

## 2017-03-03 DIAGNOSIS — Y929 Unspecified place or not applicable: Secondary | ICD-10-CM | POA: Insufficient documentation

## 2017-03-03 DIAGNOSIS — F17210 Nicotine dependence, cigarettes, uncomplicated: Secondary | ICD-10-CM | POA: Insufficient documentation

## 2017-03-03 DIAGNOSIS — S39012A Strain of muscle, fascia and tendon of lower back, initial encounter: Secondary | ICD-10-CM | POA: Insufficient documentation

## 2017-03-03 DIAGNOSIS — Y9389 Activity, other specified: Secondary | ICD-10-CM | POA: Insufficient documentation

## 2017-03-03 DIAGNOSIS — Z79899 Other long term (current) drug therapy: Secondary | ICD-10-CM | POA: Insufficient documentation

## 2017-03-03 DIAGNOSIS — X509XXA Other and unspecified overexertion or strenuous movements or postures, initial encounter: Secondary | ICD-10-CM | POA: Insufficient documentation

## 2017-03-03 MED ORDER — IBUPROFEN 600 MG PO TABS: 600.0000 mg | ORAL_TABLET | Freq: Four times a day (QID) | ORAL | 0 refills | Status: DC | PRN

## 2017-03-03 MED ORDER — METHOCARBAMOL 500 MG PO TABS: 1000.0000 mg | ORAL_TABLET | Freq: Four times a day (QID) | ORAL | 0 refills | Status: DC | PRN

## 2017-03-03 NOTE — ED Provider Notes (Signed)
[facility] Provider Note   CSN: [id number] Arrival date & time: 03/03/17  1237     History   Chief Complaint Chief Complaint  Patient presents with  . Back Pain    HPI William Sellers is a 26 y.o. male.  HPI Patient with acute onset midline low back pain starting yesterday evening after lifting a heavy box. States he's had continued pain throughout the day. No radiation of pain to the legs. No numbness or weakness. Patient denies any bladder or bowel incontinence. No fever or chills. Past Medical History:  Diagnosis Date  . Asthma   . HIV (human immunodeficiency virus infection) (HCC)   . STD (male)     Patient Active Problem List   Diagnosis Date Noted  . Tobacco abuse 02/20/2017  . Human immunodeficiency virus I infection (HCC) 11/21/2016  . Hepatitis B immune 11/21/2016  . Hepatitis A immune 11/21/2016    History reviewed. No pertinent surgical history.     Home Medications    Prior to Admission medications   Medication Sig Start Date End Date Taking? Authorizing Provider  abacavir-dolutegravir-lamiVUDine (TRIUMEQ) 600-50-300 MG tablet Take 1 tablet by mouth daily. 12/02/16   Comer, Belia Hemanobert W, MD  ibuprofen (ADVIL,MOTRIN) 600 MG tablet Take 1 tablet (600 mg total) by mouth every 6 (six) hours as needed for moderate pain. 03/03/17   Loren RacerYelverton, Mikenzi Raysor, MD  methocarbamol (ROBAXIN) 500 MG tablet Take 2 tablets (1,000 mg total) by mouth every 6 (six) hours as needed for muscle spasms. 03/03/17   Loren RacerYelverton, Abra Lingenfelter, MD    Family History No family history on file.  Social History Social History  Substance Use Topics  . Smoking status: Current Every Day Smoker    Types: Cigarettes    Start date: 08/19/2014  . Smokeless tobacco: Never Used  . Alcohol use Yes     Comment: occasional     Allergies   Patient has no known allergies.   Review of Systems Review of Systems  Constitutional: Negative for chills and fever.  Gastrointestinal: Negative for  abdominal pain, nausea and vomiting.  Genitourinary: Negative for dysuria, flank pain, frequency and hematuria.  Musculoskeletal: Positive for back pain. Negative for neck pain and neck stiffness.  Skin: Negative for rash and wound.  Neurological: Negative for dizziness, weakness, light-headedness, numbness and headaches.  All other systems reviewed and are negative.    Physical Exam Updated Vital Signs BP 139/90 (BP Location: Right Arm)   Pulse 85   Temp 98.4 F (36.9 C) (Oral)   Resp 18   Ht 6\' 1"  (1.854 m)   Wt 60.8 kg (134 lb)   SpO2 99%   BMI 17.68 kg/m   Physical Exam  Constitutional: He is oriented to person, place, and time. He appears well-developed and well-nourished.  HENT:  Head: Normocephalic and atraumatic.  Mouth/Throat: Oropharynx is clear and moist.  Eyes: Pupils are equal, round, and reactive to light. EOM are normal.  Neck: Normal range of motion. Neck supple.  Cardiovascular: Normal rate and regular rhythm.  Exam reveals no gallop and no friction rub.   No murmur heard. Pulmonary/Chest: Effort normal and breath sounds normal.  Abdominal: Soft. Bowel sounds are normal. There is no tenderness. There is no rebound and no guarding.  Musculoskeletal: Normal range of motion. He exhibits tenderness. He exhibits no edema.  Patient has midline superior lumbar tenderness to palpation. No step-offs or deformity. Negative straight leg raise bilaterally. No lower extremity swelling or asymmetry or tenderness.  Neurological:  He is alert and oriented to person, place, and time.  5/5 motor in all extremities. Sensation fully intact. No saddle anesthesia. Patient ambulates without any difficulties.  Skin: Skin is warm and dry. Capillary refill takes less than 2 seconds. No rash noted. No erythema.  Psychiatric: He has a normal mood and affect. His behavior is normal.  Nursing note and vitals reviewed.    ED Treatments / Results  Labs (all labs ordered are listed, but  only abnormal results are displayed) Labs Reviewed - No data to display  EKG  EKG Interpretation None       Radiology No results found.  Procedures Procedures (including critical care time)  Medications Ordered in ED Medications - No data to display   Initial Impression / Assessment and Plan / ED Course  I have reviewed the triage vital signs and the nursing notes.  Pertinent labs & imaging results that were available during my care of the patient were reviewed by me and considered in my medical decision making (see chart for details).     Patient with acute onset of low back pain after lifting heavy boxes. Declining any imaging studies. Neurologically intact. Will treat symptomatically. Patient says he return for any worsening symptoms or concerns.  Final Clinical Impressions(s) / ED Diagnoses   Final diagnoses:  Strain of lumbar region, initial encounter    New Prescriptions New Prescriptions   IBUPROFEN (ADVIL,MOTRIN) 600 MG TABLET    Take 1 tablet (600 mg total) by mouth every 6 (six) hours as needed for moderate pain.   METHOCARBAMOL (ROBAXIN) 500 MG TABLET    Take 2 tablets (1,000 mg total) by mouth every 6 (six) hours as needed for muscle spasms.     Loren Racer, MD 03/03/17 1430

## 2017-03-03 NOTE — ED Triage Notes (Signed)
Mid back pain since lifting a box last night. States he needs work notes for both his jobs.

## 2017-07-04 ENCOUNTER — Other Ambulatory Visit: Payer: Self-pay

## 2017-07-04 ENCOUNTER — Encounter (HOSPITAL_BASED_OUTPATIENT_CLINIC_OR_DEPARTMENT_OTHER): Payer: Self-pay | Admitting: Emergency Medicine

## 2017-07-04 ENCOUNTER — Emergency Department (HOSPITAL_BASED_OUTPATIENT_CLINIC_OR_DEPARTMENT_OTHER)
Admission: EM | Admit: 2017-07-04 | Discharge: 2017-07-04 | Disposition: A | Discharge status: Home / Self Care | Payer: Self-pay | Attending: Emergency Medicine | Admitting: Emergency Medicine

## 2017-07-04 DIAGNOSIS — F17210 Nicotine dependence, cigarettes, uncomplicated: Secondary | ICD-10-CM | POA: Insufficient documentation

## 2017-07-04 DIAGNOSIS — N341 Nonspecific urethritis: Secondary | ICD-10-CM | POA: Insufficient documentation

## 2017-07-04 DIAGNOSIS — J45909 Unspecified asthma, uncomplicated: Secondary | ICD-10-CM | POA: Insufficient documentation

## 2017-07-04 DIAGNOSIS — Z79899 Other long term (current) drug therapy: Secondary | ICD-10-CM | POA: Insufficient documentation

## 2017-07-04 DIAGNOSIS — Z21 Asymptomatic human immunodeficiency virus [HIV] infection status: Secondary | ICD-10-CM | POA: Insufficient documentation

## 2017-07-04 DIAGNOSIS — N342 Other urethritis: Secondary | ICD-10-CM

## 2017-07-04 LAB — URINALYSIS, MICROSCOPIC (REFLEX)

## 2017-07-04 LAB — URINALYSIS, ROUTINE W REFLEX MICROSCOPIC
BILIRUBIN URINE: NEGATIVE
Glucose, UA: NEGATIVE mg/dL
Hgb urine dipstick: NEGATIVE
KETONES UR: NEGATIVE mg/dL
NITRITE: NEGATIVE
PH: 6 (ref 5.0–8.0)
PROTEIN: NEGATIVE mg/dL
Specific Gravity, Urine: >1.03 — ABNORMAL HIGH (ref 1.005–1.030)

## 2017-07-04 MED ORDER — CEFTRIAXONE SODIUM 250 MG IJ SOLR
250.0000 mg | Freq: Once | INTRAMUSCULAR | Status: AC
  Administered 2017-07-04: 250 mg via INTRAMUSCULAR
  Filled 2017-07-04: qty 250

## 2017-07-04 MED ORDER — AZITHROMYCIN 250 MG PO TABS
1000.0000 mg | ORAL_TABLET | Freq: Once | ORAL | Status: AC
  Administered 2017-07-04: 1000 mg via ORAL
  Filled 2017-07-04: qty 4

## 2017-07-04 NOTE — ED Triage Notes (Signed)
Pt c/o burning urination and penile discharge since last night.  No fever or abdominal pain.

## 2017-07-04 NOTE — ED Provider Notes (Signed)
[facility] EMERGENCY DEPARTMENT Provider Note   CSN: [id number] Arrival date & time: 07/04/17  [id number]     History   Chief Complaint Chief Complaint  Patient presents with  . Penile Discharge    HPI William Sellers is a 26 y.o. male.  HPI  26 year old male with a history of HIV and prior urethritis presents with recurrent penile discharge.  Started last night when he got home from being out.  He states he has had 2 sexual partners in the last month or so that he thinks he could have gotten an STD from.  When he first woke up there was penile discharge but since wiping it away he has not seen in for.  His penis only hurts when he is urinating.  Otherwise he has no other pain such as abdominal pain or testicular pain or swelling.  Past Medical History:  Diagnosis Date  . Asthma   . HIV (human immunodeficiency virus infection) (HCC)   . STD (male)     Patient Active Problem List   Diagnosis Date Noted  . Tobacco abuse 02/20/2017  . Human immunodeficiency virus I infection (HCC) 11/21/2016  . Hepatitis B immune 11/21/2016  . Hepatitis A immune 11/21/2016    History reviewed. No pertinent surgical history.     Home Medications    Prior to Admission medications   Medication Sig Start Date End Date Taking? Authorizing Provider  abacavir-dolutegravir-lamiVUDine (TRIUMEQ) 600-50-300 MG tablet Take 1 tablet by mouth daily. 12/02/16   Gardiner Barefootomer, Robert W, MD    Family History No family history on file.  Social History Social History   Tobacco Use  . Smoking status: Current Every Day Smoker    Types: Cigarettes    Start date: 08/19/2014  . Smokeless tobacco: Never Used  Substance Use Topics  . Alcohol use: Yes    Comment: occasional  . Drug use: No     Allergies   Patient has no known allergies.   Review of Systems Review of Systems  Gastrointestinal: Negative for abdominal pain.  Genitourinary: Positive for discharge, dysuria and penile pain. Negative  for scrotal swelling and testicular pain.  All other systems reviewed and are negative.    Physical Exam Updated Vital Signs BP 121/65 (BP Location: Left Arm)   Pulse 86   Temp 97.8 F (36.6 C) (Oral)   Resp 18   Ht 6\' 1"  (1.854 m)   Wt 64.9 kg (143 lb)   SpO2 100%   BMI 18.87 kg/m   Physical Exam  Constitutional: He is oriented to person, place, and time. He appears well-developed and well-nourished. No distress.  HENT:  Head: Normocephalic and atraumatic.  Right Ear: External ear normal.  Left Ear: External ear normal.  Nose: Nose normal.  Eyes: Right eye exhibits no discharge. Left eye exhibits no discharge.  Neck: Neck supple.  Pulmonary/Chest: Effort normal.  Abdominal: Soft. There is no tenderness.  Genitourinary: Penis normal. Right testis shows no swelling and no tenderness. Left testis shows no swelling and no tenderness. Circumcised. No penile erythema or penile tenderness. No discharge found.  Neurological: He is alert and oriented to person, place, and time.  Skin: Skin is warm and dry. He is not diaphoretic.  Nursing note and vitals reviewed.    ED Treatments / Results  Labs (all labs ordered are listed, but only abnormal results are displayed) Labs Reviewed  URINALYSIS, ROUTINE W REFLEX MICROSCOPIC  GC/CHLAMYDIA PROBE AMP ([place]  EKG  EKG Interpretation None       Radiology No results found.  Procedures Procedures (including critical care time)  Medications Ordered in ED Medications  cefTRIAXone (ROCEPHIN) injection 250 mg (not administered)  azithromycin (ZITHROMAX) tablet 1,000 mg (not administered)     Initial Impression / Assessment and Plan / ED Course  I have reviewed the triage vital signs and the nursing notes.  Pertinent labs & imaging results that were available during my care of the patient were reviewed by me and considered in my medical decision making (see chart for details).     Patient's  presentations are consistent with uncomplicated urethritis.  He declines other STI testing and has known HIV.  He reports compliance with his medicine.  Otherwise he has no current discharge now but presentation is classic for urethritis.  Doubt cystitis.  Will be given Rocephin, Zithromax, and follow-up with ID specialist.  Final Clinical Impressions(s) / ED Diagnoses   Final diagnoses:  Urethritis    ED Discharge Orders    None       Pricilla LovelessGoldston, Alvie Speltz, MD 07/04/17 [phone]

## 2017-07-07 LAB — GC/CHLAMYDIA PROBE AMP (~~LOC~~) NOT AT ARMC
CHLAMYDIA, DNA PROBE: NEGATIVE
Neisseria Gonorrhea: POSITIVE — AB

## 2017-07-24 ENCOUNTER — Telehealth: Payer: Self-pay | Admitting: *Deleted

## 2017-07-24 NOTE — Telephone Encounter (Signed)
[facility] called, looking for medication confirmation. Patient last filled triumeq 04/25/17. Patient has been seen multiple times in urgent care/emergency rooms for STD symptoms/treatment. He endorses medication adherence. RN authorized 1 refill, stating he is scheduled for labs 12/20. Future refills will be determined after those are complete. Andree CossHowell, Cheila Wickstrom M, RN

## 2017-08-07 ENCOUNTER — Other Ambulatory Visit: Payer: Self-pay

## 2017-08-07 DIAGNOSIS — B20 Human immunodeficiency virus [HIV] disease: Secondary | ICD-10-CM

## 2017-08-08 LAB — T-HELPER CELL (CD4) - (RCID CLINIC ONLY)
CD4 % Helper T Cell: 32 % — ABNORMAL LOW (ref 33–55)
CD4 T Cell Abs: 420 /uL (ref 400–2700)

## 2017-08-09 LAB — HIV-1 RNA QUANT-NO REFLEX-BLD
HIV 1 RNA Quant: <20 copies/mL
HIV-1 RNA QUANT, LOG: NOT DETECTED {Log_copies}/mL

## 2017-08-25 ENCOUNTER — Encounter: Payer: Self-pay | Admitting: Internal Medicine

## 2017-08-25 ENCOUNTER — Ambulatory Visit (INDEPENDENT_AMBULATORY_CARE_PROVIDER_SITE_OTHER): Payer: Self-pay | Admitting: Internal Medicine

## 2017-08-25 VITALS — BP 118/72 | HR 53 | Temp 98.1°F | Wt 137.0 lb

## 2017-08-25 DIAGNOSIS — B20 Human immunodeficiency virus [HIV] disease: Secondary | ICD-10-CM

## 2017-08-25 DIAGNOSIS — Z7185 Encounter for immunization safety counseling: Secondary | ICD-10-CM | POA: Insufficient documentation

## 2017-08-25 DIAGNOSIS — Z7189 Other specified counseling: Secondary | ICD-10-CM

## 2017-08-25 DIAGNOSIS — Z79899 Other long term (current) drug therapy: Secondary | ICD-10-CM

## 2017-08-25 DIAGNOSIS — Z113 Encounter for screening for infections with a predominantly sexual mode of transmission: Secondary | ICD-10-CM | POA: Insufficient documentation

## 2017-08-25 NOTE — Progress Notes (Signed)
   Subjective:    Patient ID: William Sellers, male    DOB: Sep 25, 1990, 27 y.o.   MRN: [id number]  HPI Here for follow up of HIV  On Triumeq and he did run out of medications for 2 days in November but on for about 4 weeks prior to labs.  No missed doses otherwise.  No associated n/v/d.  No rashes.    Review of Systems  Constitutional: Negative for fatigue.  Skin: Negative for rash.  Neurological: Negative for dizziness.       Objective:   Physical Exam  Constitutional: He appears well-developed and well-nourished. No distress.  Eyes: No scleral icterus.  Cardiovascular: Normal rate, regular rhythm and normal heart sounds.  No murmur heard. Pulmonary/Chest: Effort normal and breath sounds normal. No respiratory distress.  Lymphadenopathy:    He has no cervical adenopathy.  Skin: No rash noted.          Assessment & Plan:

## 2017-08-25 NOTE — Assessment & Plan Note (Signed)
Discussed flu shot and he deferred for now.

## 2017-08-25 NOTE — Assessment & Plan Note (Signed)
Doing well.  Ok now after missed doses with good CD4 and viral suppression. rtc 6 months.

## 2017-08-29 ENCOUNTER — Encounter: Payer: Self-pay | Admitting: Internal Medicine

## 2017-12-17 ENCOUNTER — Other Ambulatory Visit: Payer: Self-pay | Admitting: Internal Medicine

## 2017-12-17 DIAGNOSIS — B20 Human immunodeficiency virus [HIV] disease: Secondary | ICD-10-CM

## 2018-02-09 ENCOUNTER — Other Ambulatory Visit: Payer: Self-pay

## 2018-02-09 ENCOUNTER — Other Ambulatory Visit

## 2018-02-09 DIAGNOSIS — Z113 Encounter for screening for infections with a predominantly sexual mode of transmission: Secondary | ICD-10-CM

## 2018-02-09 DIAGNOSIS — Z79899 Other long term (current) drug therapy: Secondary | ICD-10-CM

## 2018-02-09 DIAGNOSIS — B20 Human immunodeficiency virus [HIV] disease: Secondary | ICD-10-CM

## 2018-02-10 LAB — CBC WITH DIFFERENTIAL/PLATELET
BASOS ABS: 20 {cells}/uL (ref 0–200)
Basophils Relative: 0.6 %
EOS ABS: 150 {cells}/uL (ref 15–500)
EOS PCT: 4.4 %
HEMATOCRIT: 46.4 % (ref 38.5–50.0)
HEMOGLOBIN: 16.2 g/dL (ref 13.2–17.1)
LYMPHS ABS: 1139 {cells}/uL (ref 850–3900)
MCH: 30.9 pg (ref 27.0–33.0)
MCHC: 34.9 g/dL (ref 32.0–36.0)
MCV: 88.5 fL (ref 80.0–100.0)
MONOS PCT: 9.3 %
MPV: 11.1 fL (ref 7.5–12.5)
Neutro Abs: 1775 cells/uL (ref 1500–7800)
Neutrophils Relative %: 52.2 %
PLATELETS: 228 10*3/uL (ref 140–400)
RBC: 5.24 10*6/uL (ref 4.20–5.80)
RDW: 13 % (ref 11.0–15.0)
Total Lymphocyte: 33.5 %
WBC mixed population: 316 cells/uL (ref 200–950)
WBC: 3.4 10*3/uL — ABNORMAL LOW (ref 3.8–10.8)

## 2018-02-10 LAB — T-HELPER CELL (CD4) - (RCID CLINIC ONLY)
CD4 % Helper T Cell: 32 % — ABNORMAL LOW (ref 33–55)
CD4 T Cell Abs: 400 /uL (ref 400–2700)

## 2018-02-10 LAB — COMPLETE METABOLIC PANEL WITH GFR
AG Ratio: 1.9 (calc) (ref 1.0–2.5)
ALBUMIN MSPROF: 4.9 g/dL (ref 3.6–5.1)
ALKALINE PHOSPHATASE (APISO): 68 U/L (ref 40–115)
ALT: 11 U/L (ref 9–46)
AST: 22 U/L (ref 10–40)
BUN: 8 mg/dL (ref 7–25)
CO2: 28 mmol/L (ref 20–32)
CREATININE: 1.06 mg/dL (ref 0.60–1.35)
Calcium: 9.6 mg/dL (ref 8.6–10.3)
Chloride: 104 mmol/L (ref 98–110)
GFR, EST NON AFRICAN AMERICAN: 96 mL/min/{1.73_m2} (ref >=60)
GFR, Est African American: 111 mL/min/{1.73_m2} (ref >=60)
GLOBULIN: 2.6 g/dL (ref 1.9–3.7)
Glucose, Bld: 86 mg/dL (ref 65–99)
Potassium: 4.3 mmol/L (ref 3.5–5.3)
Sodium: 141 mmol/L (ref 135–146)
Total Bilirubin: 0.6 mg/dL (ref 0.2–1.2)
Total Protein: 7.5 g/dL (ref 6.1–8.1)

## 2018-02-10 LAB — LIPID PANEL
CHOL/HDL RATIO: 2.5 (calc) (ref <5.0)
CHOLESTEROL: 159 mg/dL (ref <200)
HDL: 63 mg/dL (ref >40)
LDL Cholesterol (Calc): 83 mg/dL (calc)
NON-HDL CHOLESTEROL (CALC): 96 mg/dL (ref <130)
TRIGLYCERIDES: 51 mg/dL (ref <150)

## 2018-02-10 LAB — RPR: RPR Ser Ql: NONREACTIVE

## 2018-02-12 LAB — HIV-1 RNA QUANT-NO REFLEX-BLD
HIV 1 RNA Quant: <20 copies/mL — AB
HIV-1 RNA QUANT, LOG: DETECTED {Log_copies}/mL — AB

## 2018-02-23 ENCOUNTER — Encounter: Payer: Self-pay | Admitting: Internal Medicine

## 2018-02-23 ENCOUNTER — Ambulatory Visit (INDEPENDENT_AMBULATORY_CARE_PROVIDER_SITE_OTHER): Admitting: Internal Medicine

## 2018-02-23 ENCOUNTER — Ambulatory Visit: Payer: Self-pay | Admitting: Internal Medicine

## 2018-02-23 VITALS — BP 130/84 | HR 55 | Temp 97.9°F | Ht 73.0 in | Wt 141.0 lb

## 2018-02-23 DIAGNOSIS — B20 Human immunodeficiency virus [HIV] disease: Secondary | ICD-10-CM

## 2018-02-23 DIAGNOSIS — Z23 Encounter for immunization: Secondary | ICD-10-CM | POA: Diagnosis not present

## 2018-02-23 DIAGNOSIS — Z113 Encounter for screening for infections with a predominantly sexual mode of transmission: Secondary | ICD-10-CM | POA: Diagnosis not present

## 2018-02-23 MED ORDER — PNEUMOCOCCAL 13-VAL CONJ VACC IM SUSP
0.5000 mL | Freq: Once | INTRAMUSCULAR | Status: AC
  Administered 2018-02-23: 0.5 mL via INTRAMUSCULAR

## 2018-02-23 NOTE — Assessment & Plan Note (Signed)
Doing well.  Will assure renewal.  rtc 6 months

## 2018-02-23 NOTE — Assessment & Plan Note (Signed)
Counseled on vaccine and given today 

## 2018-02-23 NOTE — Addendum Note (Signed)
Addended by: Jennet MaduroESTRIDGE, Gustavia Carie D on: 02/23/2018 12:12 PM   Modules accepted: Orders

## 2018-02-23 NOTE — Progress Notes (Signed)
   Subjective:    Patient ID: William Sellers, male    DOB: [birth year], 27 y.o.   MRN: [id number]  HPI Here for follow up of HIV [facility]  Has continued on Triumeq and no missed doses.  CD4 400, viral load < 20.  Normal creat, LFTs.  RPR NR.  No new complaints.  No associated n/v/d.      Review of Systems  Constitutional: Negative for fatigue.  Gastrointestinal: Negative for abdominal pain.  Skin: Negative for rash.       Objective:   Physical Exam  Constitutional: He appears well-developed and well-nourished. No distress.  HENT:  Mouth/Throat: No oropharyngeal exudate.  Eyes: No scleral icterus.  Cardiovascular: Normal rate, regular rhythm and normal heart sounds.  No murmur heard. Pulmonary/Chest: Effort normal and breath sounds normal. No respiratory distress.  Skin: No rash noted.   [place] [facility]  + tobacco      Assessment & Plan:

## 2018-02-23 NOTE — Assessment & Plan Note (Signed)
Screened negative 

## 2018-02-26 ENCOUNTER — Encounter: Payer: Self-pay | Admitting: Internal Medicine

## 2018-03-16 ENCOUNTER — Other Ambulatory Visit

## 2018-04-07 ENCOUNTER — Encounter: Admitting: Internal Medicine

## 2018-04-21 ENCOUNTER — Other Ambulatory Visit: Payer: Self-pay | Admitting: Internal Medicine

## 2018-04-21 DIAGNOSIS — B20 Human immunodeficiency virus [HIV] disease: Secondary | ICD-10-CM
